# Patient Record
Sex: Male | Born: 1965 | Race: Black or African American | Hispanic: No | Marital: Married | Smoking: Never smoker
Health system: Southern US, Community
[De-identification: ages and names within clinical notes are randomized; demographics above are authoritative.]

## PROBLEM LIST (undated history)

## (undated) DIAGNOSIS — D649 Anemia, unspecified: Secondary | ICD-10-CM

## (undated) DIAGNOSIS — K635 Polyp of colon: Secondary | ICD-10-CM

## (undated) DIAGNOSIS — E039 Hypothyroidism, unspecified: Secondary | ICD-10-CM

## (undated) DIAGNOSIS — K219 Gastro-esophageal reflux disease without esophagitis: Secondary | ICD-10-CM

## (undated) DIAGNOSIS — E7800 Pure hypercholesterolemia, unspecified: Secondary | ICD-10-CM

## (undated) DIAGNOSIS — E119 Type 2 diabetes mellitus without complications: Secondary | ICD-10-CM

## (undated) DIAGNOSIS — I10 Essential (primary) hypertension: Secondary | ICD-10-CM

## (undated) DIAGNOSIS — G43909 Migraine, unspecified, not intractable, without status migrainosus: Secondary | ICD-10-CM

## (undated) DIAGNOSIS — N419 Inflammatory disease of prostate, unspecified: Secondary | ICD-10-CM

## (undated) DIAGNOSIS — F419 Anxiety disorder, unspecified: Secondary | ICD-10-CM

## (undated) DIAGNOSIS — N200 Calculus of kidney: Secondary | ICD-10-CM

## (undated) HISTORY — PX: CARDIAC CATHETERIZATION: SHX172

## (undated) HISTORY — PX: THYROIDECTOMY: SHX17

---

## 1999-10-24 ENCOUNTER — Encounter: Payer: Self-pay | Admitting: Family Medicine

## 1999-10-24 ENCOUNTER — Ambulatory Visit (HOSPITAL_COMMUNITY): Admission: RE | Admit: 1999-10-24 | Discharge: 1999-10-24 | Payer: Self-pay | Admitting: Family Medicine

## 2000-01-11 ENCOUNTER — Emergency Department (HOSPITAL_COMMUNITY): Admission: EM | Admit: 2000-01-11 | Discharge: 2000-01-11 | Payer: Self-pay

## 2006-09-27 HISTORY — PX: OTHER SURGICAL HISTORY: SHX169

## 2006-09-28 HISTORY — PX: CARDIAC CATHETERIZATION: SHX172

## 2006-09-28 HISTORY — PX: TRANSTHORACIC ECHOCARDIOGRAM: SHX275

## 2006-09-29 HISTORY — PX: OTHER SURGICAL HISTORY: SHX169

## 2008-08-03 ENCOUNTER — Ambulatory Visit (HOSPITAL_COMMUNITY): Admission: RE | Admit: 2008-08-03 | Discharge: 2008-08-03 | Payer: Self-pay | Admitting: Family Medicine

## 2009-05-26 HISTORY — PX: CARDIAC CATHETERIZATION: SHX172

## 2015-05-27 HISTORY — PX: THYROIDECTOMY: SHX17

## 2015-10-29 ENCOUNTER — Encounter (HOSPITAL_COMMUNITY): Payer: Self-pay | Admitting: Nurse Practitioner

## 2015-10-29 ENCOUNTER — Emergency Department (HOSPITAL_COMMUNITY): Payer: BLUE CROSS/BLUE SHIELD

## 2015-10-29 ENCOUNTER — Inpatient Hospital Stay (HOSPITAL_COMMUNITY)
Admission: EM | Admit: 2015-10-29 | Discharge: 2015-10-31 | DRG: 690 | Disposition: A | Discharge status: Home / Self Care | Payer: BLUE CROSS/BLUE SHIELD | Attending: Internal Medicine | Admitting: Internal Medicine

## 2015-10-29 DIAGNOSIS — E876 Hypokalemia: Secondary | ICD-10-CM | POA: Diagnosis present

## 2015-10-29 DIAGNOSIS — E860 Dehydration: Secondary | ICD-10-CM

## 2015-10-29 DIAGNOSIS — R531 Weakness: Secondary | ICD-10-CM

## 2015-10-29 DIAGNOSIS — Z823 Family history of stroke: Secondary | ICD-10-CM

## 2015-10-29 DIAGNOSIS — N400 Enlarged prostate without lower urinary tract symptoms: Secondary | ICD-10-CM | POA: Diagnosis present

## 2015-10-29 DIAGNOSIS — A419 Sepsis, unspecified organism: Secondary | ICD-10-CM | POA: Diagnosis not present

## 2015-10-29 DIAGNOSIS — Z7982 Long term (current) use of aspirin: Secondary | ICD-10-CM

## 2015-10-29 DIAGNOSIS — H109 Unspecified conjunctivitis: Secondary | ICD-10-CM

## 2015-10-29 DIAGNOSIS — R509 Fever, unspecified: Secondary | ICD-10-CM

## 2015-10-29 DIAGNOSIS — Z79899 Other long term (current) drug therapy: Secondary | ICD-10-CM

## 2015-10-29 DIAGNOSIS — Z86010 Personal history of colonic polyps: Secondary | ICD-10-CM

## 2015-10-29 DIAGNOSIS — Z808 Family history of malignant neoplasm of other organs or systems: Secondary | ICD-10-CM

## 2015-10-29 DIAGNOSIS — Z79891 Long term (current) use of opiate analgesic: Secondary | ICD-10-CM

## 2015-10-29 DIAGNOSIS — G4730 Sleep apnea, unspecified: Secondary | ICD-10-CM | POA: Diagnosis present

## 2015-10-29 DIAGNOSIS — D72829 Elevated white blood cell count, unspecified: Secondary | ICD-10-CM

## 2015-10-29 DIAGNOSIS — N410 Acute prostatitis: Secondary | ICD-10-CM | POA: Diagnosis present

## 2015-10-29 DIAGNOSIS — I10 Essential (primary) hypertension: Secondary | ICD-10-CM | POA: Diagnosis present

## 2015-10-29 DIAGNOSIS — H539 Unspecified visual disturbance: Secondary | ICD-10-CM | POA: Diagnosis present

## 2015-10-29 DIAGNOSIS — N390 Urinary tract infection, site not specified: Principal | ICD-10-CM | POA: Diagnosis present

## 2015-10-29 DIAGNOSIS — Z833 Family history of diabetes mellitus: Secondary | ICD-10-CM

## 2015-10-29 DIAGNOSIS — E7800 Pure hypercholesterolemia, unspecified: Secondary | ICD-10-CM | POA: Diagnosis present

## 2015-10-29 DIAGNOSIS — G43909 Migraine, unspecified, not intractable, without status migrainosus: Secondary | ICD-10-CM | POA: Diagnosis present

## 2015-10-29 DIAGNOSIS — Z0189 Encounter for other specified special examinations: Secondary | ICD-10-CM

## 2015-10-29 HISTORY — DX: Pure hypercholesterolemia, unspecified: E78.00

## 2015-10-29 HISTORY — DX: Polyp of colon: K63.5

## 2015-10-29 HISTORY — DX: Essential (primary) hypertension: I10

## 2015-10-29 HISTORY — DX: Inflammatory disease of prostate, unspecified: N41.9

## 2015-10-29 LAB — URINALYSIS, ROUTINE W REFLEX MICROSCOPIC
Glucose, UA: NEGATIVE mg/dL
Ketones, ur: 40 mg/dL — AB
Nitrite: NEGATIVE
Protein, ur: 100 mg/dL — AB
Specific Gravity, Urine: 1.03 (ref 1.005–1.030)
pH: 5.5 (ref 5.0–8.0)

## 2015-10-29 LAB — COMPREHENSIVE METABOLIC PANEL
ALT: 16 U/L — ABNORMAL LOW (ref 17–63)
AST: 16 U/L (ref 15–41)
Albumin: 4.3 g/dL (ref 3.5–5.0)
Alkaline Phosphatase: 62 U/L (ref 38–126)
Anion gap: 8 (ref 5–15)
BUN: 17 mg/dL (ref 6–20)
CO2: 27 mmol/L (ref 22–32)
Calcium: 9.1 mg/dL (ref 8.9–10.3)
Chloride: 103 mmol/L (ref 101–111)
Creatinine, Ser: 1.23 mg/dL (ref 0.61–1.24)
GFR calc Af Amer: >60 mL/min (ref >60)
GFR calc non Af Amer: >60 mL/min (ref >60)
Glucose, Bld: 125 mg/dL — ABNORMAL HIGH (ref 65–99)
Potassium: 3 mmol/L — ABNORMAL LOW (ref 3.5–5.1)
Sodium: 138 mmol/L (ref 135–145)
Total Bilirubin: 1.3 mg/dL — ABNORMAL HIGH (ref 0.3–1.2)
Total Protein: 8.1 g/dL (ref 6.5–8.1)

## 2015-10-29 LAB — I-STAT CG4 LACTIC ACID, ED: Lactic Acid, Venous: 1.06 mmol/L (ref 0.5–2.0)

## 2015-10-29 LAB — URINE MICROSCOPIC-ADD ON

## 2015-10-29 LAB — CBC
HCT: 40.1 % (ref 39.0–52.0)
Hemoglobin: 14.1 g/dL (ref 13.0–17.0)
MCH: 28.4 pg (ref 26.0–34.0)
MCHC: 35.2 g/dL (ref 30.0–36.0)
MCV: 80.7 fL (ref 78.0–100.0)
Platelets: 223 10*3/uL (ref 150–400)
RBC: 4.97 MIL/uL (ref 4.22–5.81)
RDW: 14.2 % (ref 11.5–15.5)
WBC: 13.6 10*3/uL — ABNORMAL HIGH (ref 4.0–10.5)

## 2015-10-29 LAB — LIPASE, BLOOD: Lipase: 20 U/L (ref 11–51)

## 2015-10-29 LAB — RAPID STREP SCREEN (MED CTR MEBANE ONLY): Streptococcus, Group A Screen (Direct): NEGATIVE

## 2015-10-29 MED ORDER — SODIUM CHLORIDE 0.9 % IV BOLUS (SEPSIS)
30.0000 mL/kg | Freq: Once | INTRAVENOUS | Status: AC
  Administered 2015-10-29: 3837 mL via INTRAVENOUS

## 2015-10-29 MED ORDER — ONDANSETRON HCL 4 MG/2ML IJ SOLN
4.0000 mg | Freq: Once | INTRAMUSCULAR | Status: AC | PRN
Start: 2015-10-29 — End: 2015-10-29
  Administered 2015-10-29: 4 mg via INTRAVENOUS
  Filled 2015-10-29: qty 2

## 2015-10-29 MED ORDER — SODIUM CHLORIDE 0.9 % IV SOLN
Freq: Once | INTRAVENOUS | Status: AC
  Administered 2015-10-29: 22:00:00 via INTRAVENOUS

## 2015-10-29 MED ORDER — DEXTROSE 5 % IV SOLN
2.0000 g | Freq: Once | INTRAVENOUS | Status: AC
  Administered 2015-10-29: 2 g via INTRAVENOUS
  Filled 2015-10-29: qty 2

## 2015-10-29 MED ORDER — DEXAMETHASONE SODIUM PHOSPHATE 4 MG/ML IJ SOLN
10.0000 mg | Freq: Once | INTRAMUSCULAR | Status: AC
  Administered 2015-10-30: 10 mg via INTRAVENOUS
  Filled 2015-10-29: qty 3

## 2015-10-29 MED ORDER — LEVOFLOXACIN IN D5W 500 MG/100ML IV SOLN
500.0000 mg | Freq: Once | INTRAVENOUS | Status: AC
  Administered 2015-10-29: 500 mg via INTRAVENOUS
  Filled 2015-10-29: qty 100

## 2015-10-29 MED ORDER — HYDROMORPHONE HCL 1 MG/ML IJ SOLN
0.5000 mg | Freq: Once | INTRAMUSCULAR | Status: AC
  Administered 2015-10-29: 0.5 mg via INTRAVENOUS
  Filled 2015-10-29: qty 1

## 2015-10-29 MED ORDER — METOCLOPRAMIDE HCL 5 MG/ML IJ SOLN
10.0000 mg | Freq: Once | INTRAMUSCULAR | Status: AC
  Administered 2015-10-29: 10 mg via INTRAVENOUS
  Filled 2015-10-29: qty 2

## 2015-10-29 MED ORDER — DIPHENHYDRAMINE HCL 50 MG/ML IJ SOLN
25.0000 mg | Freq: Once | INTRAMUSCULAR | Status: AC
  Administered 2015-10-29: 25 mg via INTRAVENOUS
  Filled 2015-10-29: qty 1

## 2015-10-29 NOTE — ED Notes (Signed)
MD at bedside checking pts entire body for ticks

## 2015-10-29 NOTE — ED Provider Notes (Signed)
CSN: [id number]     Arrival date & time 10/29/15  1748 History   First MD Initiated Contact with Patient 10/29/15 1752     Chief Complaint  Patient presents with  . Nausea  . Emesis     (Consider location/radiation/quality/duration/timing/severity/associated sxs/prior Treatment) HPI First symptoms started about 7 days ago, developed headache with nausea and vomiting, mostly dry heaves. About 3 episodes actual vomiting. Hasn't been able to eat due to nausea. Has H/O migraines approximately 20 years. H/A migratory from back of head to temples. Area of H/A typical but progressed to be more severe than typical migraine. Usually gets relief from Benicar. Sumatriptan not usually effective. B/L blurred vision, typical symptom. Sent from PCP office for concern of severe dehydration, patient reports an 18 pound weight loss since his illness. Patient also reports his physician had concern for prostate infection as the patient has had approximately 3 days of dysuria.. First documented fever today at PCP office 102. First chills and sweats yesterdays evening. Had sore throat last week, improved now. Past Medical History  Diagnosis Date  . Hypertension   . Hypercholesteremia   . Prostatitis   . Colon polyp    Past Surgical History  Procedure Laterality Date  . Cardiac catheterization  1995, 2011   Family History  Problem Relation Age of Onset  . Brain cancer Mother   . Diabetes Father   . Stroke Father    Social History  Substance Use Topics  . Smoking status: Never Smoker   . Smokeless tobacco: None  . Alcohol Use: Yes     Comment: 1x/year    Review of Systems 10 Systems reviewed and are negative for acute change except as noted in the HPI.   Allergies  Review of patient's allergies indicates no known allergies.  Home Medications   Prior to Admission medications   Medication Sig Start Date End Date Taking? Authorizing Provider  acetaminophen (TYLENOL) 325 MG tablet Take 650 mg by  mouth every 6 (six) hours as needed for fever.   Yes Historical Provider, MD  aspirin EC 81 MG tablet Take 81 mg by mouth daily.   Yes Historical Provider, MD  HYDROcodone-acetaminophen (NORCO/VICODIN) 5-325 MG tablet Take 1 tablet by mouth every 6 (six) hours as needed for moderate pain or severe pain (migraine pain).  10/25/15  Yes Historical Provider, MD  SUMAtriptan (IMITREX) 100 MG tablet Take 100 mg by mouth every 2 (two) hours as needed for migraine.  10/25/15  Yes Historical Provider, MD  atorvastatin (LIPITOR) 20 MG tablet Take 20 mg by mouth daily. Reported on 10/29/2015 10/25/15   Historical Provider, MD  olmesartan-hydrochlorothiazide (BENICAR HCT) 40-25 MG tablet Take 1 tablet by mouth daily.  10/26/15   Historical Provider, MD  zolpidem (AMBIEN) 10 MG tablet Take 10 mg by mouth at bedtime.  10/25/15   Historical Provider, MD   BP 129/106 mmHg  Pulse 67  Temp(Src) 100.7 F (38.2 C) (Oral)  Resp 25  Ht  (1.778 m)  Wt 282 lb (127.914 kg)  BMI 40.46 kg/m2  SpO2 96% Physical Exam  Constitutional: He is oriented to person, place, and time.  Patient is moderately obese. Alert and nontoxic. No respiratory distress.  HENT:  Head: Normocephalic and atraumatic.  Right Ear: External ear normal.  Left Ear: External ear normal.  Nose: Nose normal.  Mouth/Throat: Oropharynx is clear and moist.  Bilateral TMs no erythema or bulging.  Eyes: EOM are normal. Pupils are equal, round, and reactive to  light.  Mild scleral injection right eye with very small amount of drainage in the medial canthus.  Neck: Neck supple.  No meningismus. Normal range of motion.  Cardiovascular: Normal rate, regular rhythm, normal heart sounds and intact distal pulses.   Pulmonary/Chest: Effort normal and breath sounds normal.  Abdominal: Soft. Bowel sounds are normal. He exhibits no distension. There is no tenderness.  Genitourinary: Penis normal.  Musculoskeletal: Normal range of motion. He exhibits no edema or  tenderness.  Lymphadenopathy:    He has no cervical adenopathy.  Neurological: He is alert and oriented to person, place, and time. No cranial nerve deficit. He exhibits normal muscle tone. Coordination normal.  Skin: Skin is warm and dry.  All skin surfaces were closely examined for evidence of insect bites, rash. No acute-appearing areas identified. Patient has several areas of older scars and papules but none appear infectious.  Psychiatric: He has a normal mood and affect.    ED Course  Procedures (including critical care time) Labs Review Labs Reviewed  COMPREHENSIVE METABOLIC PANEL - Abnormal; Notable for the following:    Potassium 3.0 (*)    Glucose, Bld 125 (*)    ALT 16 (*)    Total Bilirubin 1.3 (*)    All other components within normal limits  CBC - Abnormal; Notable for the following:    WBC 13.6 (*)    All other components within normal limits  URINALYSIS, ROUTINE W REFLEX MICROSCOPIC (NOT AT [facility]) - Abnormal; Notable for the following:    Color, Urine AMBER (*)    APPearance CLOUDY (*)    [facility] MODERATE (*)    Bilirubin Urine SMALL (*)    Ketones, ur 40 (*)    Protein, ur 100 (*)    Leukocytes, UA SMALL (*)    All other components within normal limits  URINE MICROSCOPIC-ADD ON - Abnormal; Notable for the following:    Squamous Epithelial / LPF 6-30 (*)    Bacteria, UA FEW (*)    All other components within normal limits  RAPID STREP SCREEN (NOT AT [facility])  URINE CULTURE  CULTURE, BLOOD (ROUTINE X 2)  CULTURE, BLOOD (ROUTINE X 2)  CULTURE, GROUP A STREP ([facility])  LIPASE, BLOOD  RPR  HIV ANTIBODY (ROUTINE TESTING)  I-STAT CG4 LACTIC ACID, ED  GC/CHLAMYDIA PROBE AMP ([place]) NOT AT [facility]    Imaging Review Dg Chest 2 View  10/29/2015  CLINICAL DATA:  Cough and mid chest pain for 2-3 days EXAM: CHEST  2 VIEW COMPARISON:  None. FINDINGS: The heart size and mediastinal contours are within normal limits. Both lungs are clear. The visualized skeletal  structures are unremarkable. IMPRESSION: No active cardiopulmonary disease. Electronically Signed   By: Alcide CleverMark  Lukens M.D.   On: 10/29/2015 19:30   Ct Head Wo Contrast  10/29/2015  CLINICAL DATA:  Nausea, vomiting, cough and headaches. EXAM: CT HEAD WITHOUT CONTRAST TECHNIQUE: Contiguous axial images were obtained from the base of the skull through the vertex without intravenous contrast. COMPARISON:  None. FINDINGS: No evidence of parenchymal hemorrhage or extra-axial fluid collection. No mass lesion, mass effect, or midline shift. No CT evidence of acute infarction. There are coarse plaque like dural calcifications along interhemispheric falx. Cerebral volume is age appropriate. No ventriculomegaly. Mild mucoperiosteal thickening in the bilateral ethmoidal air cells. No fluid levels in the visualized paranasal sinuses. The mastoid air cells are unopacified. No evidence of calvarial fracture. IMPRESSION: 1. No evidence of acute intracranial abnormality. 2. Mild paranasal  sinusitis, probably chronic . Electronically Signed   By: Delbert Phenix M.D.   On: 10/29/2015 19:43   I have personally reviewed and evaluated these images and lab results as part of my medical decision-making.   EKG Interpretation   Date/Time:  Monday October 29 2015 19:43:36 EDT Ventricular Rate:  81 PR Interval:  184 QRS Duration: 97 QT Interval:  364 QTC Calculation: 422 R Axis:   89 Text Interpretation:  Sinus rhythm Confirmed by Donnald Garre, MD, Lebron Conners  [phone]) on 10/29/2015 9:39:45 PM     Recheck at 22:05 patient feels improved with hydration. Mental status is clear. Patient has no respiratory distress. Vital signs stable. On salt: [facility] for admission. MDM   Final diagnoses:  Dehydration  Weakness  Fever in adult  UTI (lower urinary tract infection)   Patient presents with febrile illness. He has been sick for approximately a week. Patient is sent from PCP office with report of fever and orthostasis. Etiology  appears to be urinary tract infection was grossly positive UA. Patient's skin was carefully examined for any evidence of cellulitis or insect bite. Patient does not have any known history of bite or tick removal. Patient has had no chest pain or esterase symptoms. He also denies flank pain or abdominal pain and has no history of kidney stone. At this time, constitutional symptoms are suspected to be secondary to significant UTI. Patient be admitted for observation.    Arby Barrette, MD 10/29/15 2237

## 2015-10-29 NOTE — ED Notes (Signed)
Bed: WA20 Expected date:  Expected time:  Means of arrival:  Comments: EMS- 50yo M, n/v/urinary retention/febrile/ortho static

## 2015-10-29 NOTE — ED Notes (Signed)
Patient presents to [facility] via [facility] for complaints of nausea and vomiting that began Thursday. He reports 18 lb weight loss due to not being able to consume fluids or keep fluids down.

## 2015-10-29 NOTE — ED Notes (Signed)
Attempted to call report.  Was informed by Revonda StandardAllison, Licensed conveyancerunit secretary, nurse has been called in & will call upon arrival.

## 2015-10-30 ENCOUNTER — Observation Stay (HOSPITAL_COMMUNITY): Payer: BLUE CROSS/BLUE SHIELD

## 2015-10-30 DIAGNOSIS — H109 Unspecified conjunctivitis: Secondary | ICD-10-CM | POA: Diagnosis present

## 2015-10-30 DIAGNOSIS — Z808 Family history of malignant neoplasm of other organs or systems: Secondary | ICD-10-CM | POA: Diagnosis not present

## 2015-10-30 DIAGNOSIS — Z86010 Personal history of colonic polyps: Secondary | ICD-10-CM | POA: Diagnosis not present

## 2015-10-30 DIAGNOSIS — E876 Hypokalemia: Secondary | ICD-10-CM | POA: Diagnosis present

## 2015-10-30 DIAGNOSIS — Z833 Family history of diabetes mellitus: Secondary | ICD-10-CM | POA: Diagnosis not present

## 2015-10-30 DIAGNOSIS — G43909 Migraine, unspecified, not intractable, without status migrainosus: Secondary | ICD-10-CM | POA: Diagnosis present

## 2015-10-30 DIAGNOSIS — R509 Fever, unspecified: Secondary | ICD-10-CM | POA: Diagnosis not present

## 2015-10-30 DIAGNOSIS — G43819 Other migraine, intractable, without status migrainosus: Secondary | ICD-10-CM | POA: Diagnosis not present

## 2015-10-30 DIAGNOSIS — D72829 Elevated white blood cell count, unspecified: Secondary | ICD-10-CM | POA: Diagnosis not present

## 2015-10-30 DIAGNOSIS — H539 Unspecified visual disturbance: Secondary | ICD-10-CM | POA: Diagnosis present

## 2015-10-30 DIAGNOSIS — G4730 Sleep apnea, unspecified: Secondary | ICD-10-CM | POA: Diagnosis present

## 2015-10-30 DIAGNOSIS — Z7982 Long term (current) use of aspirin: Secondary | ICD-10-CM | POA: Diagnosis not present

## 2015-10-30 DIAGNOSIS — Z79891 Long term (current) use of opiate analgesic: Secondary | ICD-10-CM | POA: Diagnosis not present

## 2015-10-30 DIAGNOSIS — E7800 Pure hypercholesterolemia, unspecified: Secondary | ICD-10-CM | POA: Diagnosis present

## 2015-10-30 DIAGNOSIS — Z79899 Other long term (current) drug therapy: Secondary | ICD-10-CM | POA: Diagnosis not present

## 2015-10-30 DIAGNOSIS — N390 Urinary tract infection, site not specified: Secondary | ICD-10-CM | POA: Diagnosis present

## 2015-10-30 DIAGNOSIS — N400 Enlarged prostate without lower urinary tract symptoms: Secondary | ICD-10-CM | POA: Diagnosis present

## 2015-10-30 DIAGNOSIS — I10 Essential (primary) hypertension: Secondary | ICD-10-CM | POA: Diagnosis present

## 2015-10-30 DIAGNOSIS — E860 Dehydration: Secondary | ICD-10-CM | POA: Diagnosis present

## 2015-10-30 DIAGNOSIS — Z823 Family history of stroke: Secondary | ICD-10-CM | POA: Diagnosis not present

## 2015-10-30 DIAGNOSIS — A419 Sepsis, unspecified organism: Secondary | ICD-10-CM | POA: Diagnosis present

## 2015-10-30 DIAGNOSIS — N410 Acute prostatitis: Secondary | ICD-10-CM | POA: Diagnosis present

## 2015-10-30 LAB — BASIC METABOLIC PANEL
Anion gap: 9 (ref 5–15)
BUN: 15 mg/dL (ref 6–20)
CO2: 24 mmol/L (ref 22–32)
Calcium: 8.5 mg/dL — ABNORMAL LOW (ref 8.9–10.3)
Chloride: 105 mmol/L (ref 101–111)
Creatinine, Ser: 1.11 mg/dL (ref 0.61–1.24)
GFR calc Af Amer: >60 mL/min (ref >60)
GFR calc non Af Amer: >60 mL/min (ref >60)
Glucose, Bld: 173 mg/dL — ABNORMAL HIGH (ref 65–99)
Potassium: 3.3 mmol/L — ABNORMAL LOW (ref 3.5–5.1)
Sodium: 138 mmol/L (ref 135–145)

## 2015-10-30 LAB — CBC
HCT: 39.2 % (ref 39.0–52.0)
Hemoglobin: 13.1 g/dL (ref 13.0–17.0)
MCH: 27.2 pg (ref 26.0–34.0)
MCHC: 33.4 g/dL (ref 30.0–36.0)
MCV: 81.3 fL (ref 78.0–100.0)
Platelets: 204 10*3/uL (ref 150–400)
RBC: 4.82 MIL/uL (ref 4.22–5.81)
RDW: 14.2 % (ref 11.5–15.5)
WBC: 11.2 10*3/uL — ABNORMAL HIGH (ref 4.0–10.5)

## 2015-10-30 LAB — PROCALCITONIN: Procalcitonin: 0.3 ng/mL

## 2015-10-30 LAB — HIV ANTIBODY (ROUTINE TESTING W REFLEX): HIV Screen 4th Generation wRfx: NONREACTIVE

## 2015-10-30 LAB — RPR: RPR Ser Ql: NONREACTIVE

## 2015-10-30 MED ORDER — IRBESARTAN 300 MG PO TABS
300.0000 mg | ORAL_TABLET | Freq: Every day | ORAL | Status: DC
  Administered 2015-10-30 – 2015-10-31 (×2): 300 mg via ORAL
  Filled 2015-10-30 (×2): qty 2
  Filled 2015-10-30 (×2): qty 1
  Filled 2015-10-30 (×2): qty 2

## 2015-10-30 MED ORDER — HYDROMORPHONE HCL 1 MG/ML IJ SOLN: 0.5000 mg | INTRAMUSCULAR | Status: DC | PRN

## 2015-10-30 MED ORDER — MAGNESIUM SULFATE 2 GM/50ML IV SOLN: 2.0000 g | Freq: Four times a day (QID) | INTRAVENOUS | Status: DC | PRN

## 2015-10-30 MED ORDER — DEXTROSE 5 % IV SOLN
2.0000 g | INTRAVENOUS | Status: DC
  Administered 2015-10-30: 2 g via INTRAVENOUS
  Filled 2015-10-30: qty 2

## 2015-10-30 MED ORDER — DEXTROSE 5 % IV SOLN: 2.0000 g | Freq: Once | INTRAVENOUS | Status: DC

## 2015-10-30 MED ORDER — ASPIRIN EC 81 MG PO TBEC
81.0000 mg | DELAYED_RELEASE_TABLET | Freq: Every day | ORAL | Status: DC
  Administered 2015-10-30 – 2015-10-31 (×2): 81 mg via ORAL
  Filled 2015-10-30 (×3): qty 1

## 2015-10-30 MED ORDER — ONDANSETRON HCL 4 MG/2ML IJ SOLN: 4.0000 mg | Freq: Four times a day (QID) | INTRAMUSCULAR | Status: DC | PRN

## 2015-10-30 MED ORDER — ONDANSETRON HCL 4 MG PO TABS: 4.0000 mg | ORAL_TABLET | Freq: Four times a day (QID) | ORAL | Status: DC | PRN

## 2015-10-30 MED ORDER — METOCLOPRAMIDE HCL 5 MG/ML IJ SOLN
10.0000 mg | Freq: Four times a day (QID) | INTRAMUSCULAR | Status: DC | PRN
  Administered 2015-10-30: 10 mg via INTRAVENOUS
  Filled 2015-10-30: qty 2

## 2015-10-30 MED ORDER — ACETAMINOPHEN 325 MG PO TABS: 650.0000 mg | ORAL_TABLET | Freq: Four times a day (QID) | ORAL | Status: DC | PRN

## 2015-10-30 MED ORDER — HYDROCODONE-ACETAMINOPHEN 5-325 MG PO TABS: 1.0000 | ORAL_TABLET | Freq: Four times a day (QID) | ORAL | Status: DC | PRN

## 2015-10-30 MED ORDER — DIPHENHYDRAMINE HCL 50 MG/ML IJ SOLN
25.0000 mg | Freq: Four times a day (QID) | INTRAMUSCULAR | Status: DC | PRN
  Administered 2015-10-30: 25 mg via INTRAVENOUS
  Filled 2015-10-30: qty 1

## 2015-10-30 MED ORDER — POTASSIUM CHLORIDE CRYS ER 20 MEQ PO TBCR
40.0000 meq | EXTENDED_RELEASE_TABLET | ORAL | Status: AC
  Administered 2015-10-30 (×2): 40 meq via ORAL
  Filled 2015-10-30 (×2): qty 2

## 2015-10-30 MED ORDER — ACETAMINOPHEN 650 MG RE SUPP: 650.0000 mg | Freq: Four times a day (QID) | RECTAL | Status: DC | PRN

## 2015-10-30 MED ORDER — ATORVASTATIN CALCIUM 10 MG PO TABS
20.0000 mg | ORAL_TABLET | Freq: Every day | ORAL | Status: DC
Start: 2015-10-30 — End: 2015-10-31
  Administered 2015-10-30: 20 mg via ORAL
  Filled 2015-10-30 (×2): qty 2

## 2015-10-30 MED ORDER — SODIUM CHLORIDE 0.45 % IV SOLN
INTRAVENOUS | Status: DC
  Administered 2015-10-30: 18:00:00 via INTRAVENOUS

## 2015-10-30 MED ORDER — PREMIER PROTEIN SHAKE
11.0000 [oz_av] | Freq: Two times a day (BID) | ORAL | Status: DC
  Administered 2015-10-31: 11 [oz_av] via ORAL
  Filled 2015-10-30 (×2): qty 325.31

## 2015-10-30 MED ORDER — ERYTHROMYCIN 5 MG/GM OP OINT
TOPICAL_OINTMENT | Freq: Four times a day (QID) | OPHTHALMIC | Status: DC
  Administered 2015-10-30: 05:00:00 via OPHTHALMIC
  Filled 2015-10-30: qty 3.5

## 2015-10-30 MED ORDER — SODIUM CHLORIDE 0.9 % IV SOLN
INTRAVENOUS | Status: AC
  Administered 2015-10-30: 05:00:00 via INTRAVENOUS

## 2015-10-30 NOTE — Progress Notes (Signed)
PROGRESS NOTE    Carlos Greene  ZOX:[id number] DOB: 05-31-1965 DOA: 10/29/2015  PCP: No primary care provider on file.   Brief Narrative:  50 y/o with HTN h/o Prostatitis presents to the ER for 1 wk of unremitting headaches despite using Imitrex and Hydrocodone ordered by his PCP. No sinus drainage but had a sore dry throat.  He was found to have a UTI and fever of 102.7. He admitted having dysuria. He has had prostatitis twice in the past.   Subjective: Headache has resolved. Per his wife, he has frequent headaches due to stress at work but none this severe. He states "come to think about it" he has been having frequent headaches but cannot specify a trigger. He was not aware that he was dehydrated or that he had a UTI.   Assessment & Plan:   Principal Problem:   UTI (lower urinary tract infection)- sepsis with fever and leukocytosis - possible prostatitis - cont Rocephin- f/u culture - can f/u with his urologist later this week or next wk  Active Problems:   Migraine headache - resolved with Reglan, Decadron and Benadryl - can prescribe a cocktail of  Mg+, Benadryl and Reglan for future episodes- I have discussed with him what his triggers may be  - will need to see neurology if these methods are ineffective.  - asked to stop Triptan and Hydrocodone as they have not helped him  Dry eyes - worse when he is on his computer- Refresh or Systane over the counter  HTN - cont home meds.   Dehydration - cont IVF today   DVT prophylaxis: SCDs Code Status: full code Family Communication: wife Disposition Plan: home tomorrow Consultants:   none Procedures:   none Antimicrobials:  Anti-infectives    Start     Dose/Rate Route Frequency Ordered Stop   10/30/15 2200  cefTRIAXone (ROCEPHIN) 2 g in dextrose 5 % 50 mL IVPB     2 g 100 mL/hr over 30 Minutes Intravenous Every 24 hours 10/30/15 0231     10/30/15 0130  cefTRIAXone (ROCEPHIN) 2 g in dextrose 5 % 50 mL IVPB  Status:   Discontinued     2 g 100 mL/hr over 30 Minutes Intravenous  Once 10/30/15 0120 10/30/15 0123   10/29/15 2115  cefTRIAXone (ROCEPHIN) 2 g in dextrose 5 % 50 mL IVPB     2 g 100 mL/hr over 30 Minutes Intravenous  Once 10/29/15 2102 10/30/15 0006   10/29/15 2115  levofloxacin (LEVAQUIN) IVPB 500 mg     500 mg 100 mL/hr over 60 Minutes Intravenous  Once 10/29/15 2102 10/30/15 0006       Objective: Filed Vitals:   10/30/15 0009 10/30/15 0030 10/30/15 0125 10/30/15 0650  BP:  136/73 138/83 100/61  Pulse:  85 82 101  Temp: 101.1 F (38.4 C) 100.7 F (38.2 C) 99.7 F (37.6 C) 98.7 F (37.1 C)  TempSrc: Oral Oral Oral Oral  Resp:  16 20 18   Height:   5\' 10"  (1.778 m)   Weight:   125.919 kg (277 lb 9.6 oz)   SpO2:  95% 98% 99%    Intake/Output Summary (Last 24 hours) at 10/30/15 1308 Last data filed at 10/30/15 0900  Gross per 24 hour  Intake    720 ml  Output      0 ml  Net    720 ml   Filed Weights   10/29/15 1936 10/29/15 1945 10/30/15 0125  Weight: 127.914 kg (282 lb) 127.914  kg (282 lb) 125.919 kg (277 lb 9.6 oz)    Examination: General exam: Appears comfortable  HEENT: PERRLA, oral mucosa moist, no sclera icterus or thrush Respiratory system: Clear to auscultation. Respiratory effort normal. Cardiovascular system: S1 & S2 heard, RRR.  No murmurs  Gastrointestinal system: Abdomen soft, non-tender, nondistended. Normal bowel sound. No organomegaly Central nervous system: Alert and oriented. No focal neurological deficits. Extremities: No cyanosis, clubbing or edema Skin: No rashes or ulcers Psychiatry:  Mood & affect appropriate.     Data Reviewed: I have personally reviewed following labs and imaging studies  CBC:  Recent Labs Lab 10/29/15 1808 10/30/15 0121  WBC 13.6* 11.2*  HGB 14.1 13.1  HCT 40.1 39.2  MCV 80.7 81.3  PLT 223 204   Basic Metabolic Panel:  Recent Labs Lab 10/29/15 1808 10/30/15 0121  NA 138 138  K 3.0* 3.3*  CL 103 105  CO2 27  24  GLUCOSE 125* 173*  BUN 17 15  CREATININE 1.23 1.11  CALCIUM 9.1 8.5*   GFR: Estimated Creatinine Clearance: 103.7 mL/min (by C-G formula based on Cr of 1.11). Liver Function Tests:  Recent Labs Lab 10/29/15 1808  AST 16  ALT 16*  ALKPHOS 62  BILITOT 1.3*  PROT 8.1  ALBUMIN 4.3    Recent Labs Lab 10/29/15 1808  LIPASE 20   No results for input(s): AMMONIA in the last 168 hours. Coagulation Profile: No results for input(s): INR, PROTIME in the last 168 hours. Cardiac Enzymes: No results for input(s): CKTOTAL, CKMB, CKMBINDEX, TROPONINI in the last 168 hours. BNP (last 3 results) No results for input(s): PROBNP in the last 8760 hours. HbA1C: No results for input(s): HGBA1C in the last 72 hours. CBG: No results for input(s): GLUCAP in the last 168 hours. Lipid Profile: No results for input(s): CHOL, HDL, LDLCALC, TRIG, CHOLHDL, LDLDIRECT in the last 72 hours. Thyroid Function Tests: No results for input(s): TSH, T4TOTAL, FREET4, T3FREE, THYROIDAB in the last 72 hours. Anemia Panel: No results for input(s): VITAMINB12, FOLATE, FERRITIN, TIBC, IRON, RETICCTPCT in the last 72 hours. Urine analysis:    Component Value Date/Time   COLORURINE AMBER* 10/29/2015 1936   APPEARANCEUR CLOUDY* 10/29/2015 1936   LABSPEC 1.030 10/29/2015 1936   PHURINE 5.5 10/29/2015 1936   GLUCOSEU NEGATIVE 10/29/2015 1936   HGBUR MODERATE* 10/29/2015 1936   BILIRUBINUR SMALL* 10/29/2015 1936   KETONESUR 40* 10/29/2015 1936   PROTEINUR 100* 10/29/2015 1936   NITRITE NEGATIVE 10/29/2015 1936   LEUKOCYTESUR SMALL* 10/29/2015 1936   Sepsis Labs: @LABRCNTIP (procalcitonin:4,lacticidven:4) ) Recent Results (from the past 240 hour(s))  Culture, blood (routine x 2)     Status: None (Preliminary result)   Collection Time: 10/29/15  7:27 PM  Result Value Ref Range Status   Specimen Description BLOOD LEFT ARM  Final   Special Requests BOTTLES DRAWN AEROBIC AND ANAEROBIC 5CC  Final   Culture  PENDING  Incomplete   Report Status PENDING  Incomplete  Rapid strep screen     Status: None   Collection Time: 10/29/15  7:27 PM  Result Value Ref Range Status   Streptococcus, Group A Screen (Direct) NEGATIVE NEGATIVE Final    Comment: (NOTE) A Rapid Antigen test may result negative if the antigen level in the sample is below the detection level of this test. The FDA has not cleared this test as a stand-alone test therefore the rapid antigen negative result has reflexed to a Group A Strep culture.   Culture, group A strep  Status: None (Preliminary result)   Collection Time: 10/29/15  7:27 PM  Result Value Ref Range Status   Specimen Description THROAT  Final   Special Requests NONE  Final   Culture   Final    TOO YOUNG TO READ Performed at [facility]    Report Status PENDING  Incomplete  Culture, blood (routine x 2)     Status: None (Preliminary result)   Collection Time: 10/29/15  7:28 PM  Result Value Ref Range Status   Specimen Description BLOOD RIGHT ARM  Final   Special Requests BOTTLES DRAWN AEROBIC AND ANAEROBIC 5CC  Final   Culture PENDING  Incomplete   Report Status PENDING  Incomplete         Radiology Studies: Ct Abdomen Pelvis Wo Contrast  10/30/2015  CLINICAL DATA:  Fever.  UTI and prostatitis. EXAM: CT ABDOMEN AND PELVIS WITHOUT CONTRAST TECHNIQUE: Multidetector CT imaging of the abdomen and pelvis was performed following the standard protocol without IV contrast. COMPARISON:  None. FINDINGS: Lower chest and abdominal wall:  No contributory findings. Hepatobiliary: No focal liver abnormality.No evidence of biliary obstruction or stone. Pancreas: Unremarkable. Spleen: Unremarkable. Adrenals/Urinary Tract: Negative adrenals. No hydronephrosis or stone. No asymmetric stranding or nephromegaly. Unremarkable bladder. Reproductive:Haziness around a mildly enlarged prostate correlating with history of prostatitis. No asymmetry or focal finding to suggest  abscess. Stomach/Bowel:  No obstruction. No appendicitis. Vascular/Lymphatic: No acute vascular abnormality. No mass or adenopathy. Peritoneal: No ascites or pneumoperitoneum. Musculoskeletal: No acute abnormalities. IMPRESSION: 1. Pelvic stranding correlating with history prostatitis. No noncontrast evidence of abscess. 2. Normal appearance of the kidneys. Electronically Signed   By: Marnee Spring M.D.   On: 10/30/2015 04:58   Dg Chest 2 View  10/29/2015  CLINICAL DATA:  Cough and mid chest pain for 2-3 days EXAM: CHEST  2 VIEW COMPARISON:  None. FINDINGS: The heart size and mediastinal contours are within normal limits. Both lungs are clear. The visualized skeletal structures are unremarkable. IMPRESSION: No active cardiopulmonary disease. Electronically Signed   By: Alcide Clever M.D.   On: 10/29/2015 19:30   Ct Head Wo Contrast  10/29/2015  CLINICAL DATA:  Nausea, vomiting, cough and headaches. EXAM: CT HEAD WITHOUT CONTRAST TECHNIQUE: Contiguous axial images were obtained from the base of the skull through the vertex without intravenous contrast. COMPARISON:  None. FINDINGS: No evidence of parenchymal hemorrhage or extra-axial fluid collection. No mass lesion, mass effect, or midline shift. No CT evidence of acute infarction. There are coarse plaque like dural calcifications along interhemispheric falx. Cerebral volume is age appropriate. No ventriculomegaly. Mild mucoperiosteal thickening in the bilateral ethmoidal air cells. No fluid levels in the visualized paranasal sinuses. The mastoid air cells are unopacified. No evidence of calvarial fracture. IMPRESSION: 1. No evidence of acute intracranial abnormality. 2. Mild paranasal sinusitis, probably chronic . Electronically Signed   By: Delbert Phenix M.D.   On: 10/29/2015 19:43      Scheduled Meds: . sodium chloride   Intravenous STAT  . aspirin EC  81 mg Oral Daily  . atorvastatin  20 mg Oral Daily  . cefTRIAXone (ROCEPHIN)  IV  2 g Intravenous  Q24H  . erythromycin   Both Eyes Q6H  . irbesartan  300 mg Oral Daily   Continuous Infusions: . sodium chloride          Time spent in minutes: 35    Novie Maggio, MD [facility] Pager: [email or web address] Password [email or web address] 10/30/2015, 1:08 PM

## 2015-10-30 NOTE — Progress Notes (Signed)
Attempted report at 12:45 advised to call back

## 2015-10-30 NOTE — Progress Notes (Signed)
Initial Nutrition Assessment  DOCUMENTATION CODES:   Obesity unspecified  INTERVENTION:   -Provide Premier Protein supplements BID, each provides 160 kcal and 30g protein. -Reviewed healthy weight loss strategies per patient request. Will add information to Discharge instructions. -RD to continue to monitor  NUTRITION DIAGNOSIS:   Inadequate oral intake related to nausea, vomiting as evidenced by per patient/family report.  GOAL:   Patient will meet greater than or equal to 90% of their needs  MONITOR:   PO intake, Supplement acceptance, Labs, Weight trends, I & O's  REASON FOR ASSESSMENT:   Malnutrition Screening Tool    ASSESSMENT:   50 y/o with HTN h/o Prostatitis presents to the ER for 1 wk of unremitting headaches despite using Imitrex and Hydrocodone ordered by his PCP. No sinus drainage but had a sore dry throat.  Patient reports N/V for at least a week PTA. Pt was experiencing some headaches and was unable to keep any food or liquid down. States he has had a lot of stressors in his life from a son's wedding to the passing of his mother. He has lost 18 lb over 3-4 days, UBW is 302 lb. He states he has been trying to lose weight prior to these symptoms and recognizes that this is not a healthy way to lose weight. He plans to start exercising more and eat better. Reviewed healthy weight loss strategies with patient and offered to order a protein shake for him to try while he is here. Pt is willing to try Premier Protein, RD to order.  Patient's appetite has improved today since receiving antibiotics and IVF. Pt eating 100% of meals.  Nutrition focused physical exam shows no sign of depletion of muscle mass or body fat.  Medications reviewed. Labs reviewed: Low K  Diet Order:  Diet Heart Room service appropriate?: Yes; Fluid consistency:: Thin  Skin:  Reviewed, no issues  Last BM:  PTA  Height:   Ht Readings from Last 1 Encounters:  10/30/15 5\' 10"  (1.778 m)     Weight:   Wt Readings from Last 1 Encounters:  10/30/15 277 lb 9.6 oz (125.919 kg)    Ideal Body Weight:  75.5 kg  BMI:  Body mass index is 39.83 kg/(m^2).  Estimated Nutritional Needs:   Kcal:  2000-2200  Protein:  90-100g  Fluid:  2L/day  EDUCATION NEEDS:   Education needs addressed  Tilda FrancoLindsey Asante Blanda, MS, RD, LDN Pager: [phone] After Hours Pager: [phone]

## 2015-10-30 NOTE — Progress Notes (Signed)
Attempted report advised that nurse was business she would call back when available.

## 2015-10-30 NOTE — Progress Notes (Signed)
Pharmacy Antibiotic Note  Carlos Greene is a 50 y.o. male admitted on 10/29/2015 with UTI.  Pharmacy has been consulted for rocephin dosing.  Plan: Rocephin 2gm IV q24h  Height: 5\' 10"  (177.8 cm) Weight: 282 lb (127.914 kg) IBW/kg (Calculated) : 73  Temp (24hrs), Avg:100.8 F (38.2 C), Min:100.7 F (38.2 C), Max:101.1 F (38.4 C)   Recent Labs Lab 10/29/15 1808 10/29/15 2011  WBC 13.6*  --   CREATININE 1.23  --   LATICACIDVEN  --  1.06    Estimated Creatinine Clearance: 94.4 mL/min (by C-G formula based on Cr of 1.23).    No Known Allergies  Antimicrobials this admission: 6/5  rocephin >>   >>   Dose adjustments this admission:   Microbiology results:  BCx:   UCx:    Sputum:    MRSA PCR:   Thank you for allowing pharmacy to be a part of this patient's care.  Carlos Greene, Carlos Greene 10/30/2015 1:25 AM

## 2015-10-30 NOTE — Progress Notes (Addendum)
Attempted report at 12:10 place on hold:will call back

## 2015-10-30 NOTE — Progress Notes (Signed)
PHARMACY NOTE -  ceftriaxone  Pharmacy has been assisting with dosing of  ceftriaxone for suspected UTI/sespsis . Dosage remains stable at 2gm IV q24h and need for further dosage adjustment appears unlikely at present.    Will sign off at this time.  Please reconsult if a change in clinical status warrants re-evaluation of dosage.  Dorna LeitzAnh Frederic Tones, PharmD, BCPS 10/30/2015 9:01 AM

## 2015-10-30 NOTE — H&P (Signed)
History and Physical    Carlos Greene ZOX:[id number] DOB: 22-Nov-1965 DOA: 10/29/2015  PCP: Severiano Gilbert  Patient coming from: PCP's office  Chief Complaint: Nausea, vomiting, dysuria, fever, intractable headache with history of migraine  HPI: Carlos Greene is a 50 y.o. gentleman with a history of HTN, migraine headaches, and prior episodes of prostatitis (actively followed by Urology) who presents to the ED for evaluation of several persistent symptoms since Thursday.  He has had dysuria, frequency, difficulty initiating urine stream, and his wife has noticed a change in the color of his urine.  No significant odor.  No associated abdominal, pelvic, or flank pain, but he has had nausea and vomiting and pain in his lower back.  No documented fever until today.  His temperature was reportedly 102.7 in his PCP's office; 100.7 here.  He has had decreased appetite.  He has also had refractory headaches.  No associated neck pain or stiffness.  No associated neurological deficits.  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  No recent mosquito or tick bites.    ED Course: U/A is abnormal, mucous present and WBCs TNTC.  He has a leukocytosis of 13.6.  Rapid strep screen negative.  Chest xray negative for acute process.  Head CT shows probable chronic sinusitis; no acute findings.  He as received IV levaquin and rocephin, as well as aggressive volume resuscitation and analgesics as needed.  Headache still 5 out of 10 in intensity.  Hospitalist asked to admit.  Review of Systems: He reports 18lb weight gain this month.  Wife also mentions significant stressors in the past 30 days (death of his mother, then son's wedding).  As per HPI otherwise 10 point review of systems negative.    Past Medical History  Diagnosis Date  . Hypertension   . Hypercholesteremia   . Prostatitis   . Colon polyp     Past Surgical History  Procedure Laterality Date  . Cardiac  catheterization  1995, 2011     reports that he has never smoked. He does not have any smokeless tobacco history on file. He reports that he drinks alcohol. He reports that he does not use illicit drugs. He is married.  He has two adult sons.  No Known Allergies  Family History  Problem Relation Age of Onset  . Brain cancer Mother   . Diabetes Father   . Stroke Father     Prior to Admission medications   Medication Sig Start Date End Date Taking? Authorizing Provider  acetaminophen (TYLENOL) 325 MG tablet Take 650 mg by mouth every 6 (six) hours as needed for fever.   Yes Historical Provider, MD  aspirin EC 81 MG tablet Take 81 mg by mouth daily.   Yes Historical Provider, MD  HYDROcodone-acetaminophen (NORCO/VICODIN) 5-325 MG tablet Take 1 tablet by mouth every 6 (six) hours as needed for moderate pain or severe pain (migraine pain).  10/25/15  Yes Historical Provider, MD  SUMAtriptan (IMITREX) 100 MG tablet Take 100 mg by mouth every 2 (two) hours as needed for migraine.  10/25/15  Yes Historical Provider, MD  atorvastatin (LIPITOR) 20 MG tablet Take 20 mg by mouth daily. Reported on 10/29/2015 10/25/15   Historical Provider, MD  olmesartan-hydrochlorothiazide (BENICAR HCT) 40-25 MG tablet Take 1 tablet by mouth daily.  10/26/15   Historical Provider, MD  zolpidem (AMBIEN) 10 MG tablet Take 10 mg by mouth at bedtime.  10/25/15   Historical Provider, MD  Physical Exam: Filed Vitals:   10/29/15 2140 10/29/15 2200 10/29/15 2230 10/30/15 0009  BP:  123/67 126/81   Pulse:  73 74   Temp: 100.7 F (38.2 C)   101.1 F (38.4 C)  TempSrc: Oral   Oral  Resp:  21 23   Height:      Weight:      SpO2:  99% 100%       Constitutional: NAD, calm but ill appearing Filed Vitals:   10/29/15 2140 10/29/15 2200 10/29/15 2230 10/30/15 0009  BP:  123/67 126/81   Pulse:  73 74   Temp: 100.7 F (38.2 C)   101.1 F (38.4 C)  TempSrc: Oral   Oral  Resp:  21 23   Height:      Weight:      SpO2:   99% 100%    Eyes: PERRL, scleral erythema, green purulent drainage from both eyes ENMT: Mucous membranes are dry. Posterior pharynx clear of any exudate or lesions.Normal dentition.  Neck: normal, supple, no masses, no nuchal rigidity Respiratory: clear to auscultation bilaterally, no wheezing, no crackles. Normal respiratory effort. No accessory muscle use.  Cardiovascular: Regular rate and rhythm, no murmurs / rubs / gallops. No extremity edema. 2+ pedal pulses. No carotid bruits.  Abdomen: no tenderness, no masses palpated. Bowel sounds positive.  Musculoskeletal: No joint deformity upper and lower extremities. Good ROM, no contractures. Normal muscle tone.  Skin: no rashes, warm and dry Neurologic: CN 2-12 grossly intact. Sensation intact, Strength 5/5 in all 4.  Psychiatric: Normal judgment and insight. Alert and oriented x 3. Normal mood.     Labs on Admission: I have personally reviewed following labs and imaging studies  CBC:  Recent Labs Lab 10/29/15 1808  WBC 13.6*  HGB 14.1  HCT 40.1  MCV 80.7  PLT 223   Basic Metabolic Panel:  Recent Labs Lab 10/29/15 1808  NA 138  K 3.0*  CL 103  CO2 27  GLUCOSE 125*  BUN 17  CREATININE 1.23  CALCIUM 9.1   GFR: Estimated Creatinine Clearance: 94.4 mL/min (by C-G formula based on Cr of 1.23). Liver Function Tests:  Recent Labs Lab 10/29/15 1808  AST 16  ALT 16*  ALKPHOS 62  BILITOT 1.3*  PROT 8.1  ALBUMIN 4.3    Recent Labs Lab 10/29/15 1808  LIPASE 20   Urine analysis:    Component Value Date/Time   COLORURINE AMBER* 10/29/2015 1936   APPEARANCEUR CLOUDY* 10/29/2015 1936   LABSPEC 1.030 10/29/2015 1936   PHURINE 5.5 10/29/2015 1936   GLUCOSEU NEGATIVE 10/29/2015 1936   HGBUR MODERATE* 10/29/2015 1936   BILIRUBINUR SMALL* 10/29/2015 1936   KETONESUR 40* 10/29/2015 1936   PROTEINUR 100* 10/29/2015 1936   NITRITE NEGATIVE 10/29/2015 1936   LEUKOCYTESUR SMALL* 10/29/2015 1936   Sepsis Labs:    Lactic acid 1.06  Recent Results (from the past 240 hour(s))  Culture, blood (routine x 2)     Status: None (Preliminary result)   Collection Time: 10/29/15  7:27 PM  Result Value Ref Range Status   Specimen Description BLOOD LEFT ARM  Final   Special Requests BOTTLES DRAWN AEROBIC AND ANAEROBIC 5CC  Final   Culture PENDING  Incomplete   Report Status PENDING  Incomplete  Rapid strep screen     Status: None   Collection Time: 10/29/15  7:27 PM  Result Value Ref Range Status   Streptococcus, Group A Screen (Direct) NEGATIVE NEGATIVE Final    Comment: (NOTE) A  Rapid Antigen test may result negative if the antigen level in the sample is below the detection level of this test. The FDA has not cleared this test as a stand-alone test therefore the rapid antigen negative result has reflexed to a Group A Strep culture.   Culture, blood (routine x 2)     Status: None (Preliminary result)   Collection Time: 10/29/15  7:28 PM  Result Value Ref Range Status   Specimen Description BLOOD RIGHT ARM  Final   Special Requests BOTTLES DRAWN AEROBIC AND ANAEROBIC 5CC  Final   Culture PENDING  Incomplete   Report Status PENDING  Incomplete     Radiological Exams on Admission: Dg Chest 2 View  10/29/2015  CLINICAL DATA:  Cough and mid chest pain for 2-3 days EXAM: CHEST  2 VIEW COMPARISON:  None. FINDINGS: The heart size and mediastinal contours are within normal limits. Both lungs are clear. The visualized skeletal structures are unremarkable. IMPRESSION: No active cardiopulmonary disease. Electronically Signed   By: Alcide Clever M.D.   On: 10/29/2015 19:30   Ct Head Wo Contrast  10/29/2015  CLINICAL DATA:  Nausea, vomiting, cough and headaches. EXAM: CT HEAD WITHOUT CONTRAST TECHNIQUE: Contiguous axial images were obtained from the base of the skull through the vertex without intravenous contrast. COMPARISON:  None. FINDINGS: No evidence of parenchymal hemorrhage or extra-axial fluid collection. No  mass lesion, mass effect, or midline shift. No CT evidence of acute infarction. There are coarse plaque like dural calcifications along interhemispheric falx. Cerebral volume is age appropriate. No ventriculomegaly. Mild mucoperiosteal thickening in the bilateral ethmoidal air cells. No fluid levels in the visualized paranasal sinuses. The mastoid air cells are unopacified. No evidence of calvarial fracture. IMPRESSION: 1. No evidence of acute intracranial abnormality. 2. Mild paranasal sinusitis, probably chronic . Electronically Signed   By: Delbert Phenix M.D.   On: 10/29/2015 19:43    EKG: Independently reviewed. NSR, T wave inversion in multiple leads  Assessment/Plan Principal Problem:   UTI (lower urinary tract infection) Active Problems:   Migraine headache   Fever   Leukocytosis   Hypokalemia   Conjunctivitis    UTI with mild sepsis, history of prostatitis --Continue with IV Rocephin; may need to broaden coverage --Blood and urine cultures are pending --Bladder scan to check for urinary retenton --Will add CT A/P stone protocol now --May need urology consult in the AM  Migraine headache --Will try decadron and dilaudid now.  Had reglan and benadryl in the ED without significant improvement.  Hypokalemia --Replacement ordered --Hold HCTZ  Conjunctivitis --Erythromycin drops q6h  History of HTN --Continue ARB with parameters for now   DVT prophylaxis: SCDs Code Status: FULL Family Communication: Wife, son at bedside Disposition Plan: Home when ready Consults called: NONE Admission status: Observation, med surg   Jerene Bears MD [facility]  If 7PM-7AM, please contact night-coverage [email or web address] Password [email or web address]  10/30/2015, 12:12 AM

## 2015-10-30 NOTE — Discharge Instructions (Signed)
Weight Loss Tips General Tips  Eat at least three times per day.  Pay attention to your body. When you feel like you have had enough to eat, stop. Quit before you feel full, stuffed, or sick from eating. You can have more if you are really hungry.  If you still feel hungry or unsatisfied after a meal or snack, wait at least 10 minutes before you have more food. Often, the craving will go away.  Drink plenty of calorie-free drinks (water, tea, coffee, diet soda). You may be thirsty, not hungry.  Pick lean meats, low-fat or nonfat cheese, and skim (nonfat) or 1% fat milk instead of higher-fat/higher-calorie choices.  Get plenty of fiber. Vegetables, fruits, and whole grains are good sources. Have a highfiber cereal every day.  Cut back on sugar. For example, drink less fruit juice and regular soda.  Limit the amount of alcohol (beer, wine, and liquor) that you drink.  Keep all food in the kitchen. Eat only in a chosen place, such as at the table. Dont eat in the car or the bedroom or in front of the TV.  Food Preparation  Plan meals ahead of time.  Try cooking methods that cut calories: o Cook without adding fat (bake, broil, roast, boil). o Use nonstick cooking sprays instead of butter or oil. You can also use wine, broth, or fruit juice instead of oil when cooking. o Use low-calorie foods instead of high-calorie ones when possible.  Adriana Simas only what you need for one meal (dont make leftovers).  If you do make extra portions, put them away as soon as they are ready so you can save them for other meals. Store the leftovers in containers that you cant see through.  Cook when you are not hungry. For example, cook and refrigerate tomorrows dinner after you have finished eating tonight.  Make fruits, vegetables, and other low-calorie foods part of each meal.  Drink water while you cook.  Mealtimes  Drink a glass of water before you eat. Drink more during meals.  Use  smaller plates, bowls, glasses, and serving spoons. Divide your plate into four equal parts. Use one part for meat, one for starch (such as pasta, rice, potatoes, or bread), and two for nonstarchy vegetables.  Do not put serving dishes on the table. This will make it harder to take a second portion.  Put salad dressing on the side instead of mixing it with, or pouring onto your salad. Then dip your fork into the dressing before you spear a bite of salad.  Change your usual place at the table.  Make mealtime special by using pretty dishes, napkins, and glasses.  Eat slowly. Take a few one-minute breaks from eating during meals. Put your fork down between bites. Cut your food one bite at a time.  Enjoy fruit for dessert instead of cake, pie, or other sweets.  Leave a little food on your plate. (You control the food; it doesnt control you.)  Remove your plate as soon as youve finished eating.  If theres no good use for leftovers, throw them out!  Snacking Snacking can be part of your plan for healthy weight loss. You can eat six times per day as long as you plan what to eat and dont eat too many calories.  Plan ahead. Be sure to have healthy snacks on hand. If the right food is not there, you may be more likely to eat whatever is available, such as candy, cookies, chips, leftovers, or  other quick choices.  Keep low-calorie snacks in a special part of the refrigerator. Good choices include the following: o Reduced-fat string cheese, low-calorie yogurt, and nonfat milk. o Washed, bite-size pieces of raw vegetables, such as carrots, celery, pepper strips, cucumbers, broccoli, and cauliflower. Serve with low-calorie dips. o Fresh fruit.  Eating and Emotions Do you use eating to deal with feelings other than hunger, such as boredom, being tired, or stress? If you eat for these reasons, here are some other things you can try:  Call a friend for support.  Use inspirational  quotes to help you avoid the temptation to eat.  Take a warm bath or shower.  Listen to music or a relaxation CD.  Take a walk.  Try activities that keep you from eating. For example, its hard to eat while youre exercising. If you are gardening, you probably wont eat while your hands are covered in soil.

## 2015-10-30 NOTE — Progress Notes (Signed)
Received report .wbb 

## 2015-10-31 ENCOUNTER — Encounter: Payer: Self-pay | Admitting: Internal Medicine

## 2015-10-31 DIAGNOSIS — R509 Fever, unspecified: Secondary | ICD-10-CM

## 2015-10-31 DIAGNOSIS — N390 Urinary tract infection, site not specified: Principal | ICD-10-CM

## 2015-10-31 DIAGNOSIS — E860 Dehydration: Secondary | ICD-10-CM

## 2015-10-31 DIAGNOSIS — G43819 Other migraine, intractable, without status migrainosus: Secondary | ICD-10-CM

## 2015-10-31 DIAGNOSIS — D72829 Elevated white blood cell count, unspecified: Secondary | ICD-10-CM

## 2015-10-31 LAB — URINE CULTURE

## 2015-10-31 LAB — CBC
HCT: 36.8 % — ABNORMAL LOW (ref 39.0–52.0)
Hemoglobin: 12.2 g/dL — ABNORMAL LOW (ref 13.0–17.0)
MCH: 26.8 pg (ref 26.0–34.0)
MCHC: 33.2 g/dL (ref 30.0–36.0)
MCV: 80.9 fL (ref 78.0–100.0)
Platelets: 228 10*3/uL (ref 150–400)
RBC: 4.55 MIL/uL (ref 4.22–5.81)
RDW: 14.5 % (ref 11.5–15.5)
WBC: 15 10*3/uL — ABNORMAL HIGH (ref 4.0–10.5)

## 2015-10-31 LAB — BASIC METABOLIC PANEL
Anion gap: 9 (ref 5–15)
BUN: 19 mg/dL (ref 6–20)
CO2: 24 mmol/L (ref 22–32)
Calcium: 8.4 mg/dL — ABNORMAL LOW (ref 8.9–10.3)
Chloride: 108 mmol/L (ref 101–111)
Creatinine, Ser: 0.97 mg/dL (ref 0.61–1.24)
GFR calc Af Amer: >60 mL/min (ref >60)
GFR calc non Af Amer: >60 mL/min (ref >60)
Glucose, Bld: 150 mg/dL — ABNORMAL HIGH (ref 65–99)
Potassium: 4.2 mmol/L (ref 3.5–5.1)
Sodium: 141 mmol/L (ref 135–145)

## 2015-10-31 MED ORDER — LEVOFLOXACIN 750 MG PO TABS
750.0000 mg | ORAL_TABLET | Freq: Every day | ORAL | Status: AC
Start: 2015-11-01 — End: 2015-11-15

## 2015-10-31 MED ORDER — OLMESARTAN MEDOXOMIL 20 MG PO TABS: 20.0000 mg | ORAL_TABLET | Freq: Every day | ORAL | Status: DC

## 2015-10-31 MED ORDER — LEVOFLOXACIN IN D5W 750 MG/150ML IV SOLN
750.0000 mg | INTRAVENOUS | Status: DC
  Administered 2015-10-31: 750 mg via INTRAVENOUS
  Filled 2015-10-31: qty 150

## 2015-10-31 MED ORDER — DIPHENHYDRAMINE HCL 25 MG PO TABS: 25.0000 mg | ORAL_TABLET | Freq: Four times a day (QID) | ORAL | Status: DC | PRN

## 2015-10-31 NOTE — Progress Notes (Signed)
Pharmacy Antibiotic Note  Carlos Greene is a 50 y.o. male admitted on 10/29/2015 with UTI.  Ceftriaxone started on admission for suspected UTI/prostatitis.  To change abx to levaquin today.  - All cultures negative thus far - Afeb, wbc up 15, scr 0.97 (crcl~100)  Plan: - levaquin 750 mg IV q24h - Pharmacy will sign off. Re-consult us if need further assistance  ___________________________  Height: 5\' 10"  (177.8 cm) Weight: 277 lb 9.6 oz (125.919 kg) IBW/kg (Calculated) : 73  Temp (24hrs), Avg:97.8 F (36.6 C), Min:97.7 F (36.5 C), Max:98.1 F (36.7 C)   Recent Labs Lab 10/29/15 1808 10/29/15 2011 10/30/15 0121 10/31/15 0407  WBC 13.6*  --  11.2* 15.0*  CREATININE 1.23  --  1.11 0.97  LATICACIDVEN  --  1.06  --   --     Estimated Creatinine Clearance: 118.7 mL/min (by C-G formula based on Cr of 0.97).    No Known Allergies  Thank you for allowing pharmacy to be a part of this patient's care.  Carlos Greene, Carlos Greene 10/31/2015 1:07 PM

## 2015-10-31 NOTE — Progress Notes (Signed)
Discharge instructions reviewed with patient and wife. Both verbalized understanding. Patient instructed to follow up with primary physician for continuation of current medication changes.

## 2015-10-31 NOTE — Discharge Summary (Signed)
[facility] Discharge Summary   Patient: Carlos Greene ZOX:[id number]   PCP: Allean Found, MD DOB: [birth year]   Date of admission: 10/29/2015   Date of discharge:  10/31/2015    Discharge Diagnoses:  Principal Problem:   UTI (lower urinary tract infection) Active Problems:   Migraine headache   Fever   Leukocytosis   Hypokalemia   Conjunctivitis   Dehydration   Fever in adult  Recommendations for Outpatient Follow-up:  1. Please follow-up with PCP in one week,  2. Patient will need sleep apnea assessment as an outpatient. 3. Please follow-up with urology in 1-2 weeks for a prostatitis to assess whether you need longer duration of treatment.   Follow-up Information    Follow up with Allean Found, MD. Schedule an appointment as soon as possible for a visit in 1 week.   Specialty:  Family Medicine   Why:  Recheck on prostatitis, need sleep apnea assessment   Contact information:   [address] [place] [address] [phone]       Follow up with [facility]. Schedule an appointment as soon as possible for a visit in 2 weeks.   Why:  for follow up on prostatitis.    Contact information:   [address] [place] [address] [place] [address] [phone]     Diet recommendation: Regular diet  Activity: The patient is advised to gradually reintroduce usual activities.  Discharge Condition: good  History of present illness: As per the H and P dictated on admission, "Carlos Greene is a 50 y.o. gentleman with a history of HTN, migraine headaches, and prior episodes of prostatitis (actively followed by Urology) who presents to the ED for evaluation of several persistent symptoms since Thursday. He has had dysuria, frequency, difficulty initiating urine stream, and his wife has noticed a change in the color of his urine. No significant odor. No associated abdominal, pelvic, or flank pain, but he has had nausea and vomiting  and pain in his lower back. No documented fever until today. His temperature was reportedly 102.7 in his PCP's office; 100.7 here. He has had decreased appetite. He has also had refractory headaches. No associated neck pain or stiffness. No associated neurological deficits. 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 No recent mosquito or tick bites. "  Hospital Course:  Summary of his active problems in the hospital is as following.  Principal Problem:   UTI (lower urinary tract infection) Acute prostatitis. The patient presents with complaints of nausea vomiting and dysuria with fever. He also had intractable headache. He has been having a headache for last 1 week. He was found to have a UTI. Urine culture growing multiple organisms. A CT abdomen pelvis was showing signs of prostatitis without any other acute abnormality. Without any abscess. I discussed with urology on phone and the patient will need a follow-up in 1-2 weeks with urology to decide whether he needs longer duration off treatment. Patient has been able to urinate without any difficulty. Denies any complains of retention or frequency. Patient has mild elevation in WBC as compared to admission, but blood cultures remains negative thus no evidence of sepsis. Antibiotic vise pt was initially on ceftriaxone but due to better prostate penetration, Levaquin has been chosen as antibiotics of choice on discharge. Patient will be given 2 weeks treatment course.  Active Problems:   Migraine headache Currently resolved. Recommendations resume sumatriptan and uses home medications. Follow-up with PCP.  Benadryl when necessary for headache.    Hypokalemia Resolved.   Needs sleep apnea assessment. Patient complains of heavy snoring as well as difficulty sleeping requiring to sleep in recliner. atient also complains of increased sleepiness during the day. With worsening  headache. These signs suggest patient has probable sleep apnea and would benefit from sleep study assessment. Recommend PCP to order as an outpatient.   Essential hypertension. Patient's blood pressure remains stable. This is despite holding his medication. Therefore on discharge I would reduce the dose of on his heart and 20 mg daily. Also discontinue HCTZ component on discharge. Patient will follow-up with PCP regarding further changes in blood pressure medication.  All other chronic medical condition were stable during the hospitalization.  Patient was ambulatory without any assistance. On the day of the discharge the patient's vitals were stable, and no other acute medical condition were reported by patient. the patient was felt safe to be discharge at home with family.  Procedures and Results:  none   Consultations:  urology  DISCHARGE MEDICATION: Current Discharge Medication List    START taking these medications   Details  diphenhydrAMINE (BENADRYL) 25 MG tablet Take 1 tablet (25 mg total) by mouth every 6 (six) hours as needed (Use for migraine, call PCP if no improvement after 2 tablets). Qty: 30 tablet, Refills: 0    levofloxacin (LEVAQUIN) 750 MG tablet Take 1 tablet (750 mg total) by mouth daily. Qty: 13 tablet, Refills: 0    olmesartan (BENICAR) 20 MG tablet Take 1 tablet (20 mg total) by mouth daily. Qty: 30 tablet, Refills: 0      CONTINUE these medications which have NOT CHANGED   Details  acetaminophen (TYLENOL) 325 MG tablet Take 650 mg by mouth every 6 (six) hours as needed for fever.    aspirin EC 81 MG tablet Take 81 mg by mouth daily.    HYDROcodone-acetaminophen (NORCO/VICODIN) 5-325 MG tablet Take 1 tablet by mouth every 6 (six) hours as needed for moderate pain or severe pain (migraine pain).     SUMAtriptan (IMITREX) 100 MG tablet Take 100 mg by mouth every 2 (two) hours as needed for migraine.     atorvastatin (LIPITOR) 20 MG tablet Take 20  mg by mouth daily. Reported on 10/29/2015    zolpidem (AMBIEN) 10 MG tablet Take 10 mg by mouth at bedtime.       STOP taking these medications     olmesartan-hydrochlorothiazide (BENICAR HCT) 40-25 MG tablet        No Known Allergies Discharge Instructions    Ambulatory referral to Sleep Studies    Complete by:  As directed      Diet general    Complete by:  As directed      Discharge instructions    Complete by:  As directed   It is important that you read following instructions as well as go over your medication list with RN to help you understand your care after this hospitalization.  Discharge Instructions: Please follow-up with PCP in one week  Please request your primary care physician to go over all Hospital Tests and Procedure/Radiological results at the follow up,  Please get all Hospital records sent to your PCP by signing hospital release before you go home.   Do not take more than prescribed Pain, Sleep and Anxiety Medications. You were cared for by a hospitalist during your hospital stay. If you have any questions about your discharge medications or the care you received while you  were in the hospital after you are discharged, you can call the unit and ask to speak with the hospitalist on call if the hospitalist that took care of you is not available.  Once you are discharged, your primary care physician will handle any further medical issues. Please note that NO REFILLS for any discharge medications will be authorized once you are discharged, as it is imperative that you return to your primary care physician (or establish a relationship with a primary care physician if you do not have one) for your aftercare needs so that they can reassess your need for medications and monitor your lab values. You Must read complete instructions/literature along with all the possible adverse reactions/side effects for all the Medicines you take and that have been prescribed to you. Take any  new Medicines after you have completely understood and accept all the possible adverse reactions/side effects. Wear Seat belts while driving. If you have smoked or chewed Tobacco in the last 2 yrs please stop smoking and/or stop any Recreational drug use.     Encourage fluids    Complete by:  As directed      Increase activity slowly    Complete by:  As directed           Discharge Exam: Filed Weights   10/29/15 1936 10/29/15 1945 10/30/15 0125  Weight: 127.914 kg (282 lb) 127.914 kg (282 lb) 125.919 kg (277 lb 9.6 oz)   Filed Vitals:   10/30/15 2130 10/31/15 0545  BP: 120/66 109/55  Pulse: 59 51  Temp: 97.7 F (36.5 C) 97.7 F (36.5 C)  Resp:     General: Appear in no distress, no Rash; Oral Mucosa moist. Cardiovascular: S1 and S2 Present, no Murmur, no JVD Respiratory: Bilateral Air entry present and Clear to Auscultation, no Crackles, no wheezes Abdomen: Bowel Sound present, Soft and no tenderness Extremities: no Pedal edema, no calf tenderness Neurology: Grossly no focal neuro deficit.  The results of significant diagnostics from this hospitalization (including imaging, microbiology, ancillary and laboratory) are listed below for reference.    Significant Diagnostic Studies: Ct Abdomen Pelvis Wo Contrast  10/30/2015  CLINICAL DATA:  Fever.  UTI and prostatitis. EXAM: CT ABDOMEN AND PELVIS WITHOUT CONTRAST TECHNIQUE: Multidetector CT imaging of the abdomen and pelvis was performed following the standard protocol without IV contrast. COMPARISON:  None. FINDINGS: Lower chest and abdominal wall:  No contributory findings. Hepatobiliary: No focal liver abnormality.No evidence of biliary obstruction or stone. Pancreas: Unremarkable. Spleen: Unremarkable. Adrenals/Urinary Tract: Negative adrenals. No hydronephrosis or stone. No asymmetric stranding or nephromegaly. Unremarkable bladder. Reproductive:Haziness around a mildly enlarged prostate correlating with history of prostatitis. No  asymmetry or focal finding to suggest abscess. Stomach/Bowel:  No obstruction. No appendicitis. Vascular/Lymphatic: No acute vascular abnormality. No mass or adenopathy. Peritoneal: No ascites or pneumoperitoneum. Musculoskeletal: No acute abnormalities. IMPRESSION: 1. Pelvic stranding correlating with history prostatitis. No noncontrast evidence of abscess. 2. Normal appearance of the kidneys. Electronically Signed   By: Marnee Spring M.D.   On: 10/30/2015 04:58   Dg Chest 2 View  10/29/2015  CLINICAL DATA:  Cough and mid chest pain for 2-3 days EXAM: CHEST  2 VIEW COMPARISON:  None. FINDINGS: The heart size and mediastinal contours are within normal limits. Both lungs are clear. The visualized skeletal structures are unremarkable. IMPRESSION: No active cardiopulmonary disease. Electronically Signed   By: Alcide Clever M.D.   On: 10/29/2015 19:30   Ct Head Wo Contrast  10/29/2015  CLINICAL DATA:  Nausea, vomiting, cough and headaches. EXAM: CT HEAD WITHOUT CONTRAST TECHNIQUE: Contiguous axial images were obtained from the base of the skull through the vertex without intravenous contrast. COMPARISON:  None. FINDINGS: No evidence of parenchymal hemorrhage or extra-axial fluid collection. No mass lesion, mass effect, or midline shift. No CT evidence of acute infarction. There are coarse plaque like dural calcifications along interhemispheric falx. Cerebral volume is age appropriate. No ventriculomegaly. Mild mucoperiosteal thickening in the bilateral ethmoidal air cells. No fluid levels in the visualized paranasal sinuses. The mastoid air cells are unopacified. No evidence of calvarial fracture. IMPRESSION: 1. No evidence of acute intracranial abnormality. 2. Mild paranasal sinusitis, probably chronic . Electronically Signed   By: Delbert Phenix M.D.   On: 10/29/2015 19:43    Microbiology: Recent Results (from the past 240 hour(s))  Culture, blood (routine x 2)     Status: None (Preliminary result)    Collection Time: 10/29/15  7:27 PM  Result Value Ref Range Status   Specimen Description BLOOD LEFT ARM  Final   Special Requests BOTTLES DRAWN AEROBIC AND ANAEROBIC 5CC  Final   Culture   Final    NO GROWTH 2 DAYS Performed at [facility]    Report Status PENDING  Incomplete  Rapid strep screen     Status: None   Collection Time: 10/29/15  7:27 PM  Result Value Ref Range Status   Streptococcus, Group A Screen (Direct) NEGATIVE NEGATIVE Final    Comment: (NOTE) A Rapid Antigen test may result negative if the antigen level in the sample is below the detection level of this test. The FDA has not cleared this test as a stand-alone test therefore the rapid antigen negative result has reflexed to a Group A Strep culture.   Culture, group A strep     Status: None (Preliminary result)   Collection Time: 10/29/15  7:27 PM  Result Value Ref Range Status   Specimen Description THROAT  Final   Special Requests NONE  Final   Culture   Final    CULTURE REINCUBATED FOR BETTER GROWTH Performed at [facility]    Report Status PENDING  Incomplete  Culture, blood (routine x 2)     Status: None (Preliminary result)   Collection Time: 10/29/15  7:28 PM  Result Value Ref Range Status   Specimen Description BLOOD RIGHT ARM  Final   Special Requests BOTTLES DRAWN AEROBIC AND ANAEROBIC 5CC  Final   Culture   Final    NO GROWTH 2 DAYS Performed at [facility]    Report Status PENDING  Incomplete  Urine culture     Status: Abnormal   Collection Time: 10/29/15  7:36 PM  Result Value Ref Range Status   Specimen Description URINE, CLEAN CATCH  Final   Special Requests NONE  Final   Culture MULTIPLE SPECIES PRESENT, SUGGEST RECOLLECTION (A)  Final   Report Status 10/31/2015 FINAL  Final     Labs: CBC:  Recent Labs Lab 10/29/15 1808 10/30/15 0121 10/31/15 0407  WBC 13.6* 11.2* 15.0*  HGB 14.1 13.1 12.2*  HCT 40.1 39.2 36.8*  MCV 80.7 81.3 80.9  PLT 223 204 228    Basic Metabolic Panel:  Recent Labs Lab 10/29/15 1808 10/30/15 0121 10/31/15 0407  NA 138 138 141  K 3.0* 3.3* 4.2  CL 103 105 108  CO2 GLUCOSE 125* 173* 150*  BUN CREATININE 1.23 1.11 0.97  CALCIUM 9.1 8.5*  8.4*   Liver Function Tests:  Recent Labs Lab 10/29/15 1808  AST 16  ALT 16*  ALKPHOS 62  BILITOT 1.3*  PROT 8.1  ALBUMIN 4.3    Recent Labs Lab 10/29/15 1808  LIPASE 20   Time spent: 30 minutes  Signed:  PATEL, PRANAV  [facility]  10/31/2015  , 5:25 PM

## 2015-11-01 LAB — URINE CULTURE: Culture: NO GROWTH

## 2015-11-01 LAB — CULTURE, GROUP A STREP (THRC)

## 2015-11-03 LAB — CULTURE, BLOOD (ROUTINE X 2)
Culture: NO GROWTH
Culture: NO GROWTH

## 2016-01-06 ENCOUNTER — Encounter (HOSPITAL_BASED_OUTPATIENT_CLINIC_OR_DEPARTMENT_OTHER): Payer: Self-pay | Admitting: Emergency Medicine

## 2016-01-06 ENCOUNTER — Emergency Department (HOSPITAL_BASED_OUTPATIENT_CLINIC_OR_DEPARTMENT_OTHER)
Admission: EM | Admit: 2016-01-06 | Discharge: 2016-01-06 | Disposition: A | Discharge status: Home / Self Care | Payer: BLUE CROSS/BLUE SHIELD | Attending: Emergency Medicine | Admitting: Emergency Medicine

## 2016-01-06 DIAGNOSIS — Z79899 Other long term (current) drug therapy: Secondary | ICD-10-CM | POA: Diagnosis not present

## 2016-01-06 DIAGNOSIS — I10 Essential (primary) hypertension: Secondary | ICD-10-CM | POA: Insufficient documentation

## 2016-01-06 DIAGNOSIS — K222 Esophageal obstruction: Secondary | ICD-10-CM | POA: Insufficient documentation

## 2016-01-06 DIAGNOSIS — R1310 Dysphagia, unspecified: Secondary | ICD-10-CM | POA: Diagnosis present

## 2016-01-06 MED ORDER — OMEPRAZOLE 2 MG/ML ORAL SUSPENSION: 20.0000 mg | Freq: Two times a day (BID) | ORAL | 0 refills | Status: DC

## 2016-01-06 MED ORDER — LORAZEPAM 2 MG/ML IJ SOLN
2.0000 mg | Freq: Once | INTRAMUSCULAR | Status: DC
  Filled 2016-01-06: qty 1

## 2016-01-06 MED ORDER — FAMOTIDINE 40 MG/5ML PO SUSR
40.0000 mg | Freq: Once | ORAL | Status: DC
  Filled 2016-01-06: qty 5

## 2016-01-06 MED ORDER — LORAZEPAM 1 MG PO TABS: 1.0000 mg | ORAL_TABLET | Freq: Three times a day (TID) | ORAL | 0 refills | Status: DC | PRN

## 2016-01-06 MED ORDER — LORAZEPAM 2 MG/ML IJ SOLN
2.0000 mg | Freq: Once | INTRAMUSCULAR | Status: AC
  Administered 2016-01-06: 2 mg via INTRAMUSCULAR
  Filled 2016-01-06: qty 1

## 2016-01-06 NOTE — ED Triage Notes (Signed)
Pt in c/o difficulty swallowing although is able to successfully swallow. States has had this problem before and is related to stricture. Alert, interactive, airway intact, in NAD.

## 2016-01-06 NOTE — Discharge Instructions (Signed)
Liquids only tonight.  Call Dr.Shearin's office in the morning for a same-day appointment.

## 2016-01-06 NOTE — ED Triage Notes (Signed)
Pt called and family member states he is in restroom.

## 2016-01-08 NOTE — ED Provider Notes (Signed)
[facility]-EMERGENCY DEPT Provider Note   CSN: [id number] Arrival date & time: 01/06/16  1438     History   Chief Complaint Chief Complaint  Patient presents with  . Dysphagia    HPI Carlos Greene is a 50 y.o. male. He is history of esophageal stricture. He's had undergo dilatation the past. He has not seen his [facility] neurologist for 5 years. His become symptomatic over the last week with some foods "hanging out" as he swallows. He now feels anxious. However he swallows he feels like "my throat and a close often and choked me". He is swallowing liquids without difficulty. He actually had chicken yesterday without difficulty. He does not feel as though he has a constant sensation of foreign body is not drooling is not vomiting. HPI  Past Medical History:  Diagnosis Date  . Colon polyp   . Hypercholesteremia   . Hypertension   . Prostatitis     Patient Active Problem List   Diagnosis Date Noted  . Dehydration   . Fever in adult   . UTI (lower urinary tract infection) 10/29/2015  . Migraine headache 10/29/2015  . Fever 10/29/2015  . Leukocytosis 10/29/2015  . Hypokalemia 10/29/2015  . Conjunctivitis 10/29/2015    Past Surgical History:  Procedure Laterality Date  . CARDIAC CATHETERIZATION  1995, 2011       Home Medications    Prior to Admission medications   Medication Sig Start Date End Date Taking? Authorizing Provider  acetaminophen (TYLENOL) 325 MG tablet Take 650 mg by mouth every 6 (six) hours as needed for fever.    Historical Provider, MD  aspirin EC 81 MG tablet Take 81 mg by mouth daily.    Historical Provider, MD  atorvastatin (LIPITOR) 20 MG tablet Take 20 mg by mouth daily. Reported on 10/29/2015 10/25/15   Historical Provider, MD  diphenhydrAMINE (BENADRYL) 25 MG tablet Take 1 tablet (25 mg total) by mouth every 6 (six) hours as needed (Use for migraine, call PCP if no improvement after 2 tablets). 10/31/15   Rolly SalterPranav M Patel, MD  HYDROcodone-acetaminophen  (NORCO/VICODIN) 5-325 MG tablet Take 1 tablet by mouth every 6 (six) hours as needed for moderate pain or severe pain (migraine pain).  10/25/15   Historical Provider, MD  olmesartan (BENICAR) 20 MG tablet Take 1 tablet (20 mg total) by mouth daily. 10/31/15   Rolly SalterPranav M Patel, MD  SUMAtriptan (IMITREX) 100 MG tablet Take 100 mg by mouth every 2 (two) hours as needed for migraine.  10/25/15   Historical Provider, MD  zolpidem (AMBIEN) 10 MG tablet Take 10 mg by mouth at bedtime.  10/25/15   Historical Provider, MD    Family History Family History  Problem Relation Age of Onset  . Brain cancer Mother   . Diabetes Father   . Stroke Father     Social History Social History  Substance Use Topics  . Smoking status: Never Smoker  . Smokeless tobacco: Not on file  . Alcohol use Yes     Comment: 1x/year     Allergies   Review of patient's allergies indicates no known allergies.   Review of Systems Review of Systems  Constitutional: Negative for appetite change, chills, diaphoresis, fatigue and fever.  HENT: Negative for mouth sores, sore throat and trouble swallowing.   Eyes: Negative for visual disturbance.  Respiratory: Negative for cough, chest tightness, shortness of breath and wheezing.   Cardiovascular: Negative for chest pain.  Gastrointestinal: Negative for abdominal distention, abdominal pain, diarrhea, nausea  and vomiting.       Sensation of dysphasia in the upper chest lower neck.  Endocrine: Negative for polydipsia, polyphagia and polyuria.  Genitourinary: Negative for dysuria, frequency and hematuria.  Musculoskeletal: Negative for gait problem.  Skin: Negative for color change, pallor and rash.  Neurological: Negative for dizziness, syncope, light-headedness and headaches.  Hematological: Does not bruise/bleed easily.  Psychiatric/Behavioral: Negative for behavioral problems and confusion.     Physical Exam Updated Vital Signs BP 168/99 (BP Location: Left Arm)   Pulse  (!) 54   Temp 97.8 F (36.6 C) (Oral)   Resp 18   Ht 5\' 10"  (1.778 m)   Wt 286 lb (129.7 kg)   SpO2 100%   BMI 41.04 kg/m   Physical Exam  Constitutional: He is oriented to person, place, and time. He appears well-developed and well-nourished. No distress.  Anxious pacing adult male. He is drinking water in the room without regurgitation or drooling. Normal. No postpharyngeal swelling. Clear lungs. Benign abdomen.  HENT:  Head: Normocephalic.  Eyes: Conjunctivae are normal. Pupils are equal, round, and reactive to light. No scleral icterus.  Neck: Normal range of motion. Neck supple. No thyromegaly present.  Cardiovascular: Normal rate and regular rhythm.  Exam reveals no gallop and no friction rub.   No murmur heard. Pulmonary/Chest: Effort normal and breath sounds normal. No respiratory distress. He has no wheezes. He has no rales.  Abdominal: Soft. Bowel sounds are normal. He exhibits no distension. There is no tenderness. There is no rebound.  Musculoskeletal: Normal range of motion.  Neurological: He is alert and oriented to person, place, and time.  Skin: Skin is warm and dry. No rash noted.  Psychiatric: He has a normal mood and affect. His behavior is normal.     ED Treatments / Results  Labs (all labs ordered are listed, but only abnormal results are displayed) Labs Reviewed - No data to display  EKG  EKG Interpretation None       Radiology No results found.  Procedures Procedures (including critical care time)  Medications Ordered in ED Medications  LORazepam (ATIVAN) injection 2 mg (2 mg Intramuscular Given 01/06/16 1614)     Initial Impression / Assessment and Plan / ED Course  I have reviewed the triage vital signs and the nursing notes.  Pertinent labs & imaging results that were available during my care of the patient were reviewed by me and considered in my medical decision making (see chart for details).  Clinical Course    Patient anxious.  However has a normal physical examination. He is taking liquids without difficulty. I discussed the case with the GI physician on call [facility]. They have open slots available and can see him tomorrow to discuss repeat heat esophageal dilatation  Final Clinical Impressions(s) / ED Diagnoses   Final diagnoses:  Esophageal stricture    New Prescriptions Discharge Medication List as of 01/06/2016  3:43 PM    START taking these medications   Details  LORazepam (ATIVAN) 1 MG tablet Take 1 tablet (1 mg total) by mouth 3 (three) times daily as needed for anxiety (May crush before taking)., Starting Sun 01/06/2016, Print    omeprazole (PRILOSEC) 2 mg/mL SUSP Take 10 mLs (20 mg total) by mouth 2 (two) times daily before a meal., Starting Sun 01/06/2016, Print         Rolland PorterMark Tylie Golonka, MD 01/08/16 1130

## 2016-01-24 ENCOUNTER — Emergency Department (HOSPITAL_BASED_OUTPATIENT_CLINIC_OR_DEPARTMENT_OTHER): Payer: BLUE CROSS/BLUE SHIELD

## 2016-01-24 ENCOUNTER — Emergency Department (HOSPITAL_BASED_OUTPATIENT_CLINIC_OR_DEPARTMENT_OTHER)
Admission: EM | Admit: 2016-01-24 | Discharge: 2016-01-24 | Disposition: A | Discharge status: Home / Self Care | Payer: BLUE CROSS/BLUE SHIELD | Attending: Emergency Medicine | Admitting: Emergency Medicine

## 2016-01-24 ENCOUNTER — Encounter (HOSPITAL_BASED_OUTPATIENT_CLINIC_OR_DEPARTMENT_OTHER): Payer: Self-pay | Admitting: *Deleted

## 2016-01-24 DIAGNOSIS — F458 Other somatoform disorders: Secondary | ICD-10-CM | POA: Diagnosis not present

## 2016-01-24 DIAGNOSIS — R0989 Other specified symptoms and signs involving the circulatory and respiratory systems: Secondary | ICD-10-CM

## 2016-01-24 DIAGNOSIS — Z79899 Other long term (current) drug therapy: Secondary | ICD-10-CM | POA: Diagnosis not present

## 2016-01-24 DIAGNOSIS — Z7982 Long term (current) use of aspirin: Secondary | ICD-10-CM | POA: Diagnosis not present

## 2016-01-24 DIAGNOSIS — I10 Essential (primary) hypertension: Secondary | ICD-10-CM | POA: Diagnosis not present

## 2016-01-24 DIAGNOSIS — R198 Other specified symptoms and signs involving the digestive system and abdomen: Secondary | ICD-10-CM

## 2016-01-24 MED ORDER — LORAZEPAM 2 MG/ML IJ SOLN
2.0000 mg | Freq: Once | INTRAMUSCULAR | Status: AC
  Administered 2016-01-24: 2 mg via INTRAMUSCULAR
  Filled 2016-01-24: qty 1

## 2016-01-24 MED ORDER — LORAZEPAM 1 MG PO TABS: 1.0000 mg | ORAL_TABLET | Freq: Three times a day (TID) | ORAL | 0 refills | Status: DC | PRN

## 2016-01-24 MED ORDER — LABETALOL HCL 5 MG/ML IV SOLN: 20.0000 mg | Freq: Once | INTRAVENOUS | Status: DC

## 2016-01-24 MED ORDER — METHOCARBAMOL 500 MG PO TABS: 500.0000 mg | ORAL_TABLET | Freq: Once | ORAL | Status: DC

## 2016-01-24 NOTE — ED Provider Notes (Signed)
[facility]   CSN: [id number] Arrival date & time: 01/24/16  0027     History   Chief Complaint Chief Complaint  Patient presents with  . Choking    HPI Carlos Greene is a 50 y.o. male was seen in the ED on the 13th of this month complaining of difficulty swallowing and a sensation of something being in his throat. He reported a history of esophageal stricture requiring dilatation. The patient was seen by a gastroenterologist 2 days later and had an upper endoscopy. This endoscopy did not show an esophageal stricture but there was concern for achalasia and he did undergo dilated. A gastric polyp was found and biopsied at that time.  He is here complaining of a sensation of a tightening or choking sensation in his throat. He states it is difficult to swallow although he is able to swallow and ate soup with crackers earlier. This sensation is worse when he lies supine, and causes significant anxiety, so he has been sleeping sitting up. He has not been vomiting and he is able to swallow his secretions.  He is taking omeprazole 40 milligrams twice daily. When he was seen on the 13th he was given a shot of lorazepam which significantly improved his symptoms.  HPI  Past Medical History:  Diagnosis Date  . Colon polyp   . Hypercholesteremia   . Hypertension   . Prostatitis     Patient Active Problem List   Diagnosis Date Noted  . Dehydration   . Fever in adult   . UTI (lower urinary tract infection) 10/29/2015  . Migraine headache 10/29/2015  . Fever 10/29/2015  . Leukocytosis 10/29/2015  . Hypokalemia 10/29/2015  . Conjunctivitis 10/29/2015    Past Surgical History:  Procedure Laterality Date  . CARDIAC CATHETERIZATION  1995, 2011       Home Medications    Prior to Admission medications   Medication Sig Start Date End Date Taking? Authorizing Provider  omeprazole (PRILOSEC) 40 MG capsule Take 40 mg by mouth 2 (two) times daily.   Yes  Historical Provider, MD  acetaminophen (TYLENOL) 325 MG tablet Take 650 mg by mouth every 6 (six) hours as needed for fever.    Historical Provider, MD  aspirin EC 81 MG tablet Take 81 mg by mouth daily.    Historical Provider, MD  atorvastatin (LIPITOR) 20 MG tablet Take 20 mg by mouth daily. Reported on 10/29/2015 10/25/15   Historical Provider, MD  diphenhydrAMINE (BENADRYL) 25 MG tablet Take 1 tablet (25 mg total) by mouth every 6 (six) hours as needed (Use for migraine, call PCP if no improvement after 2 tablets). 10/31/15   Rolly Salter, MD  HYDROcodone-acetaminophen (NORCO/VICODIN) 5-325 MG tablet Take 1 tablet by mouth every 6 (six) hours as needed for moderate pain or severe pain (migraine pain).  10/25/15   Historical Provider, MD  olmesartan (BENICAR) 20 MG tablet Take 1 tablet (20 mg total) by mouth daily. 10/31/15   Rolly Salter, MD  SUMAtriptan (IMITREX) 100 MG tablet Take 100 mg by mouth every 2 (two) hours as needed for migraine.  10/25/15   Historical Provider, MD  zolpidem (AMBIEN) 10 MG tablet Take 10 mg by mouth at bedtime.  10/25/15   Historical Provider, MD    Family History Family History  Problem Relation Age of Onset  . Brain cancer Mother   . Diabetes Father   . Stroke Father     Social History Social History  Substance  Use Topics  . Smoking status: Never Smoker  . Smokeless tobacco: Not on file  . Alcohol use Yes     Comment: 1x/year     Allergies   Review of patient's allergies indicates no known allergies.   Review of Systems Review of Systems  All other systems reviewed and are negative.   Physical Exam Updated Vital Signs BP 136/84 (BP Location: Right Arm)   Pulse 60   Temp 97.8 F (36.6 C) (Oral)   Resp 18   Wt 283 lb (128.4 kg)   SpO2 98%   BMI 40.61 kg/m   Physical Exam General: Well-developed, well-nourished male in no acute distress; appearance consistent with age of record HENT: normocephalic; atraumatic; no pharyngeal erythema, exudate  or edema; no stridor; no dysphonia; observed swallowing water without difficulty Eyes: pupils equal, round and reactive to light; extraocular muscles intact Neck: supple Heart: regular rate and rhythm Lungs: clear to auscultation bilaterally Abdomen: soft; nondistended; nontender; bowel sounds present Extremities: No deformity; full range of motion; pulses normal Neurologic: Awake, alert and oriented; motor function intact in all extremities and symmetric; no facial droop Skin: Warm and dry Psychiatric: Anxious    ED Treatments / Results  Nursing notes and vitals signs, including pulse oximetry, reviewed.  Summary of this visit's results, reviewed by myself:  Imaging Studies: Dg Neck Soft Tissue  Result Date: 01/24/2016 CLINICAL DATA:  Worsening swallowing dysfunction after esophageal dilatation 3 weeks ago. EXAM: NECK SOFT TISSUES - 1+ VIEW COMPARISON:  None. FINDINGS: There is no evidence of retropharyngeal soft tissue swelling or epiglottic enlargement. The cervical airway is unremarkable and no radio-opaque foreign body identified. IMPRESSION: Negative. Electronically Signed   By: Ellery Plunkaniel R Mitchell M.D.   On: 01/24/2016 02:18   2:31 AM Patient feeling better after IM lorazepam. I suspect there is an element of globus hystericus involved. This is a diagnosis of exclusion and he was encouraged to follow-up with his gastroenterologist, Dr. Lanae BoastShearin.  Procedures (including critical care time)   Final Clinical Impressions(s) / ED Diagnoses   Final diagnoses:  Globus sensation      Paula LibraJohn Nichoals Heyde, MD 01/24/16 [id number]

## 2016-01-24 NOTE — ED Triage Notes (Signed)
Pt c/o " im choking" x 1 day, recent procedure for esophagus stricture x 1 week ago

## 2016-02-03 ENCOUNTER — Encounter (HOSPITAL_BASED_OUTPATIENT_CLINIC_OR_DEPARTMENT_OTHER): Payer: Self-pay | Admitting: Emergency Medicine

## 2016-02-03 ENCOUNTER — Emergency Department (HOSPITAL_BASED_OUTPATIENT_CLINIC_OR_DEPARTMENT_OTHER): Payer: BLUE CROSS/BLUE SHIELD

## 2016-02-03 ENCOUNTER — Emergency Department (HOSPITAL_BASED_OUTPATIENT_CLINIC_OR_DEPARTMENT_OTHER)
Admission: EM | Admit: 2016-02-03 | Discharge: 2016-02-04 | Disposition: A | Discharge status: Home / Self Care | Payer: BLUE CROSS/BLUE SHIELD | Attending: Emergency Medicine | Admitting: Emergency Medicine

## 2016-02-03 DIAGNOSIS — R0789 Other chest pain: Secondary | ICD-10-CM | POA: Diagnosis not present

## 2016-02-03 DIAGNOSIS — R519 Headache, unspecified: Secondary | ICD-10-CM

## 2016-02-03 DIAGNOSIS — R51 Headache: Secondary | ICD-10-CM | POA: Diagnosis present

## 2016-02-03 DIAGNOSIS — Z79899 Other long term (current) drug therapy: Secondary | ICD-10-CM | POA: Insufficient documentation

## 2016-02-03 DIAGNOSIS — I10 Essential (primary) hypertension: Secondary | ICD-10-CM | POA: Insufficient documentation

## 2016-02-03 DIAGNOSIS — Z7982 Long term (current) use of aspirin: Secondary | ICD-10-CM | POA: Insufficient documentation

## 2016-02-03 DIAGNOSIS — E049 Nontoxic goiter, unspecified: Secondary | ICD-10-CM | POA: Diagnosis not present

## 2016-02-03 LAB — CBC
HCT: 45.6 % (ref 39.0–52.0)
Hemoglobin: 15.8 g/dL (ref 13.0–17.0)
MCH: 26.9 pg (ref 26.0–34.0)
MCHC: 34.6 g/dL (ref 30.0–36.0)
MCV: 77.7 fL — ABNORMAL LOW (ref 78.0–100.0)
Platelets: 238 10*3/uL (ref 150–400)
RBC: 5.87 MIL/uL — ABNORMAL HIGH (ref 4.22–5.81)
RDW: 14.1 % (ref 11.5–15.5)
WBC: 4 10*3/uL (ref 4.0–10.5)

## 2016-02-03 LAB — BASIC METABOLIC PANEL
Anion gap: 12 (ref 5–15)
BUN: 11 mg/dL (ref 6–20)
CO2: 29 mmol/L (ref 22–32)
Calcium: 9.9 mg/dL (ref 8.9–10.3)
Chloride: 95 mmol/L — ABNORMAL LOW (ref 101–111)
Creatinine, Ser: 1.15 mg/dL (ref 0.61–1.24)
GFR calc Af Amer: >60 mL/min (ref >60)
GFR calc non Af Amer: >60 mL/min (ref >60)
Glucose, Bld: 126 mg/dL — ABNORMAL HIGH (ref 65–99)
Potassium: 3 mmol/L — ABNORMAL LOW (ref 3.5–5.1)
Sodium: 136 mmol/L (ref 135–145)

## 2016-02-03 LAB — TROPONIN I: Troponin I: <0.03 ng/mL (ref <0.03)

## 2016-02-03 MED ORDER — IOPAMIDOL (ISOVUE-300) INJECTION 61%
100.0000 mL | Freq: Once | INTRAVENOUS | Status: AC | PRN
  Administered 2016-02-03: 100 mL via INTRAVENOUS

## 2016-02-03 MED ORDER — DIPHENHYDRAMINE HCL 50 MG/ML IJ SOLN
25.0000 mg | Freq: Once | INTRAMUSCULAR | Status: AC
  Administered 2016-02-03: 25 mg via INTRAVENOUS
  Filled 2016-02-03: qty 1

## 2016-02-03 MED ORDER — METOCLOPRAMIDE HCL 5 MG/ML IJ SOLN
10.0000 mg | Freq: Once | INTRAMUSCULAR | Status: AC
  Administered 2016-02-03: 10 mg via INTRAVENOUS
  Filled 2016-02-03: qty 2

## 2016-02-03 NOTE — ED Provider Notes (Signed)
[facility] Provider Note: Lowella Dell, MD, FACEP  CSN: [id number] MRN: [id number] ARRIVAL: 02/03/16 at 2150 By signing my name below, I, Octavia Heir, attest that this documentation has been prepared under the direction and in the presence of Paula Libra, MD.  Electronically Signed: Octavia Heir, ED Scribe. 02/03/16. 11:18 PM.  CHIEF COMPLAINT  Headache   HISTORY OF PRESENT ILLNESS  Carlos Greene is a 50 y.o. male who has a PMhx of HTN and hypercholesteremia presents to the Emergency Department complaining of sudden onset, gradual worsening, moderate headache x 1 week. He reports associated light-headedness, diaphoresis, bilateral "aching eyes" and chest tightness x 2 hours. He further notes losing ~ 26 lbs due to being on a liquid diet. Pt has a hx of migraines but notes this is different because of his eye pain. He says he checked BP tonight and it was 142/91. He was just started on amlodipine on Friday after being taken off of benicar last week. Pt has taken hydrocodone to alleviate his headache with no relief. Pt denies nausea, vomiting, diarrhea, fever, chills or shortness of breath.   Past Medical History:  Diagnosis Date  . Colon polyp   . Hypercholesteremia   . Hypertension   . Prostatitis     Past Surgical History:  Procedure Laterality Date  . CARDIAC CATHETERIZATION  1995, 2011    Family History  Problem Relation Age of Onset  . Brain cancer Mother   . Diabetes Father   . Stroke Father     Social History  Substance Use Topics  . Smoking status: Never Smoker  . Smokeless tobacco: Never Used  . Alcohol use Yes     Comment: 1x/year    Prior to Admission medications   Medication Sig Start Date End Date Taking? Authorizing Provider  amLODipine (NORVASC) 5 MG tablet Take 5 mg by mouth daily.   Yes Historical Provider, MD  hydrochlorothiazide (HYDRODIURIL) 25 MG tablet Take 25 mg by mouth daily.   Yes Historical Provider, MD  acetaminophen  (TYLENOL) 325 MG tablet Take 650 mg by mouth every 6 (six) hours as needed for fever.    Historical Provider, MD  aspirin EC 81 MG tablet Take 81 mg by mouth daily.    Historical Provider, MD  atorvastatin (LIPITOR) 20 MG tablet Take 20 mg by mouth daily. Reported on 10/29/2015 10/25/15   Historical Provider, MD  diphenhydrAMINE (BENADRYL) 25 MG tablet Take 1 tablet (25 mg total) by mouth every 6 (six) hours as needed (Use for migraine, call PCP if no improvement after 2 tablets). 10/31/15   Rolly Salter, MD  HYDROcodone-acetaminophen (NORCO/VICODIN) 5-325 MG tablet Take 1 tablet by mouth every 6 (six) hours as needed for moderate pain or severe pain (migraine pain).  10/25/15   Historical Provider, MD  LORazepam (ATIVAN) 1 MG tablet Take 1 tablet (1 mg total) by mouth 3 (three) times daily as needed for anxiety. 01/24/16   Chamia Schmutz, MD  omeprazole (PRILOSEC) 40 MG capsule Take 40 mg by mouth 2 (two) times daily.    Historical Provider, MD  SUMAtriptan (IMITREX) 100 MG tablet Take 100 mg by mouth every 2 (two) hours as needed for migraine.  10/25/15   Historical Provider, MD  zolpidem (AMBIEN) 10 MG tablet Take 10 mg by mouth at bedtime.  10/25/15   Historical Provider, MD    Allergies Amoxicillin and Benicar [olmesartan]   REVIEW OF SYSTEMS  Negative except as noted here or in the History of  Present Illness.   PHYSICAL EXAMINATION  Initial Vital Signs Blood pressure 118/91, pulse 68, resp. rate 16, height 5\' 10"  (1.778 m), weight 270 lb (122.5 kg), SpO2 98 %.  Examination General: Well-developed, well-nourished male in no acute distress; appearance consistent with age of record HENT: normocephalic; atraumatic Eyes: pupils equal, round and reactive to light; extraocular muscles intact Neck: supple; right thyroid enlargement Heart: regular rate and rhythm; no murmurs, rubs or gallops Lungs: clear to auscultation bilaterally Abdomen: soft; nondistended; nontender; no masses or hepatosplenomegaly;  bowel sounds present Extremities: No deformity; full range of motion; pulses normal Neurologic: Awake, alert and oriented; motor function intact in all extremities and symmetric; no facial droop Skin: Warm and dry Psychiatric: Normal mood and affect   RESULTS  Summary of this visit's results, reviewed by myself:   EKG Interpretation  Date/Time:  Sunday February 03 2016 21:55:48 EDT Ventricular Rate:  84 PR Interval:  164 QRS Duration: 90 QT Interval:  358 QTC Calculation: 423 R Axis:   76 Text Interpretation:  Normal sinus rhythm Nonspecific T wave abnormality Abnormal ECG No previous ECGs available Confirmed by Patte Winkel  MD, Jonny Ruiz ([id number]) on 02/03/2016 10:45:31 PM      Laboratory Studies: Results for orders placed or performed during the hospital encounter of 02/03/16 (from the past 24 hour(s))  Basic metabolic panel     Status: Abnormal   Collection Time: 02/03/16 10:35 PM  Result Value Ref Range   Sodium 136 135 - 145 mmol/L   Potassium 3.0 (L) 3.5 - 5.1 mmol/L   Chloride 95 (L) 101 - 111 mmol/L   CO2 29 22 - 32 mmol/L   Glucose, Bld 126 (H) 65 - 99 mg/dL   BUN 11 6 - 20 mg/dL   Creatinine, Ser 6.04 0.61 - 1.24 mg/dL   Calcium 9.9 8.9 - 54.0 mg/dL   GFR calc non Af Amer >60 >60 mL/min   GFR calc Af Amer >60 >60 mL/min   Anion gap 12 5 - 15  CBC     Status: Abnormal   Collection Time: 02/03/16 10:35 PM  Result Value Ref Range   WBC 4.0 4.0 - 10.5 K/uL   RBC 5.87 (H) 4.22 - 5.81 MIL/uL   Hemoglobin 15.8 13.0 - 17.0 g/dL   HCT 98.1 19.1 - 47.8 %   MCV 77.7 (L) 78.0 - 100.0 fL   MCH 26.9 26.0 - 34.0 pg   MCHC 34.6 30.0 - 36.0 g/dL   RDW 29.5 62.1 - 30.8 %   Platelets 238 150 - 400 K/uL  Troponin I     Status: None   Collection Time: 02/03/16 10:35 PM  Result Value Ref Range   Troponin I <0.03 <0.03 ng/mL  Troponin I     Status: None   Collection Time: 02/04/16  1:35 AM  Result Value Ref Range   Troponin I <0.03 <0.03 ng/mL   Imaging Studies: Dg Chest 2  View  Result Date: 02/03/2016 CLINICAL DATA:  Chest pain. Headache and chest tightness for 2 days. EXAM: CHEST  2 VIEW COMPARISON:  Soft tissue neck radiographs 01/24/2016 FINDINGS: Leftward tracheal deviation is unchanged from recent soft tissue neck radiographs. The cardiomediastinal contours are normal. The lungs are clear. Pulmonary vasculature is normal. No consolidation, pleural effusion, or pneumothorax. No acute osseous abnormalities are seen. IMPRESSION: 1. No active cardiopulmonary disease. 2. Unchanged leftward tracheal deviation from recent soft tissue neck radiographs. This could be further evaluated with contrast-enhanced neck CT, and could be performed on  an elective basis based on patient symptomatology. Electronically Signed   By: Rubye OaksMelanie  Ehinger M.D.   On: 02/03/2016 23:03   Ct Head Wo Contrast  Result Date: 02/04/2016 CLINICAL DATA:  Sudden onset headache for 1 week, gradually worsening. EXAM: CT HEAD WITHOUT CONTRAST TECHNIQUE: Contiguous axial images were obtained from the base of the skull through the vertex without intravenous contrast. COMPARISON:  None FINDINGS: Brain: No evidence of acute infarction, hemorrhage, hydrocephalus, extra-axial collection or mass lesion/mass effect. Vascular: No hyperdense vessel or unexpected calcification. Skull: Normal. Negative for fracture or focal lesion. Sinuses/Orbits: No acute finding. IMPRESSION: Normal brain Electronically Signed   By: Ellery Plunkaniel R Mitchell M.D.   On: 02/04/2016 01:18   Ct Soft Tissue Neck W Contrast  Result Date: 02/04/2016 CLINICAL DATA:  Initial evaluation for choking sensation. EXAM: CT NECK WITH CONTRAST TECHNIQUE: Multidetector CT imaging of the neck was performed using the standard protocol following the bolus administration of intravenous contrast. CONTRAST:  100mL ISOVUE-300 IOPAMIDOL (ISOVUE-300) INJECTION 61% COMPARISON:  Prior radiograph from 01/24/2016. FINDINGS: Visualized portions of the brain are unremarkable.  Globes and orbits within normal limits. Mild scattered mucoperiosteal thickening within the ethmoidal air cells. Small retention CS noted within the left maxillary and right sphenoid sinus. Paranasal sinuses are otherwise clear. No mastoid effusion. Middle ear cavities are clear. Salivary glands including the parotid glands and submandibular glands are normal. Oral cavity within normal limits. No acute abnormality about the dentition. Palatine tonsils within normal limits. Parapharyngeal fat preserved. Nasopharynx normal. Epiglottis partially effaced by the lingual tonsils but within normal limits. No retropharyngeal fluid collection. Vallecula filled by the lingual tonsils without acute abnormality. Remainder of the hypopharynx and supraglottic larynx within normal limits. True cords symmetric and normal. Subglottic airway mildly attenuated and bowed to the left. Thyroid gland is markedly enlarged and heterogeneous in appearance, likely related to multinodular goiter. Right lobe is larger than the left, with secondary mass effect on the subglottic trachea which is bowed to the left and mildly narrowed. Substernal extension into the upper mediastinum. No adenopathy within the neck. Visualized upper mediastinum otherwise unremarkable. Visualized lungs are clear. Normal intravascular enhancement seen throughout the neck. Proximal great vessels displayed around the enlarged thyroid. No acute osseus abnormality. No worrisome lytic or blastic osseous lesions. IMPRESSION: 1. Enlarged heterogeneous thyroid with substernal extension, likely related to multinodular goiter. There is secondary mass effect on the subglottic trachea which is bowed to the left and mildly narrowed. Query this is source of patient's choking sensation. 2. No other acute abnormality within the neck. Electronically Signed   By: Rise MuBenjamin  McClintock M.D.   On: 02/04/2016 00:49    ED COURSE  Nursing notes and initial vitals signs, including pulse  oximetry, reviewed.  1:35 AM Patient's headache significantly improved after IV medications. Patient informed of CT findings showing goiter with mass effect on the trachea which may be responsible for his globus sensation. Second troponin pending.  2:20 AM Patient feeling much better. He has a follow-up appointment with a gastroenterologist today [facility] sounds like [facility] fiberoptic laryngoscopy. He was given a CD of his CT findings and was advised to discuss the goiter findings at his appointments today and tomorrow.  PROCEDURES    ED DIAGNOSES     ICD-9-CM ICD-10-CM   1. Atypical chest pain 786.59 R07.89   2. Goiter 240.9 E04.9   3. Bad headache 784.0 R51     I personally performed the services described in this  documentation, which was scribed in my presence. The recorded information has been reviewed and is accurate.    Paula Libra, MD 02/04/16 Earle Gell

## 2016-02-03 NOTE — ED Triage Notes (Addendum)
Patient reports tightness in the chest x1 hour.  Denies shortness of breath.  Denies nausea/vomiting.  States he feels light headed.  States elevated BP for the past 2 weeks causing headaches.  Patient reports pain in head at present is worse than chest.  Patient took 5mg  hydrocodone prior to arrival with no relief in pain.

## 2016-02-04 LAB — TROPONIN I: Troponin I: <0.03 ng/mL (ref <0.03)

## 2016-02-04 MED ORDER — HYDROMORPHONE HCL 1 MG/ML IJ SOLN: 1.0000 mg | Freq: Once | INTRAMUSCULAR | Status: DC

## 2016-02-04 NOTE — ED Notes (Signed)
MD at bedside to discuss results with pt. Repeat Troponin done. Pt resting comfortably.

## 2016-02-04 NOTE — ED Notes (Signed)
MD at bedside. 

## 2016-02-04 NOTE — ED Notes (Signed)
Pt given d/c instructions as per chart. Verbalizes understanding. No questions. 

## 2016-02-05 ENCOUNTER — Other Ambulatory Visit (HOSPITAL_COMMUNITY): Payer: Self-pay | Admitting: Family Medicine

## 2016-02-05 DIAGNOSIS — E049 Nontoxic goiter, unspecified: Secondary | ICD-10-CM

## 2016-12-12 ENCOUNTER — Emergency Department (HOSPITAL_BASED_OUTPATIENT_CLINIC_OR_DEPARTMENT_OTHER)
Admission: EM | Admit: 2016-12-12 | Discharge: 2016-12-12 | Disposition: A | Discharge status: Home / Self Care | Payer: BLUE CROSS/BLUE SHIELD | Attending: Emergency Medicine | Admitting: Emergency Medicine

## 2016-12-12 ENCOUNTER — Encounter (HOSPITAL_BASED_OUTPATIENT_CLINIC_OR_DEPARTMENT_OTHER): Payer: Self-pay | Admitting: *Deleted

## 2016-12-12 DIAGNOSIS — R51 Headache: Secondary | ICD-10-CM | POA: Diagnosis present

## 2016-12-12 DIAGNOSIS — Z79899 Other long term (current) drug therapy: Secondary | ICD-10-CM | POA: Insufficient documentation

## 2016-12-12 DIAGNOSIS — I10 Essential (primary) hypertension: Secondary | ICD-10-CM | POA: Diagnosis not present

## 2016-12-12 DIAGNOSIS — Z7982 Long term (current) use of aspirin: Secondary | ICD-10-CM | POA: Diagnosis not present

## 2016-12-12 DIAGNOSIS — G43009 Migraine without aura, not intractable, without status migrainosus: Secondary | ICD-10-CM | POA: Diagnosis not present

## 2016-12-12 HISTORY — DX: Migraine, unspecified, not intractable, without status migrainosus: G43.909

## 2016-12-12 MED ORDER — SODIUM CHLORIDE 0.9 % IV BOLUS (SEPSIS)
1000.0000 mL | Freq: Once | INTRAVENOUS | Status: AC
  Administered 2016-12-12: 1000 mL via INTRAVENOUS

## 2016-12-12 MED ORDER — DIPHENHYDRAMINE HCL 50 MG/ML IJ SOLN
25.0000 mg | Freq: Once | INTRAMUSCULAR | Status: AC
  Administered 2016-12-12: 25 mg via INTRAVENOUS
  Filled 2016-12-12: qty 1

## 2016-12-12 MED ORDER — KETOROLAC TROMETHAMINE 30 MG/ML IJ SOLN
30.0000 mg | Freq: Once | INTRAMUSCULAR | Status: AC
  Administered 2016-12-12: 30 mg via INTRAVENOUS
  Filled 2016-12-12: qty 1

## 2016-12-12 MED ORDER — METOCLOPRAMIDE HCL 5 MG/ML IJ SOLN
10.0000 mg | Freq: Once | INTRAMUSCULAR | Status: AC
  Administered 2016-12-12: 10 mg via INTRAVENOUS
  Filled 2016-12-12: qty 2

## 2016-12-12 NOTE — Discharge Instructions (Signed)
Return to the ED with any concerns including vomiting, changes in vision or speech, weakness of arms or legs, decreased level of alertness/lethargy, or any other alarming symptoms °

## 2016-12-12 NOTE — ED Triage Notes (Addendum)
Pt c/o h/a x 12 hrs also c/o burred vision  HX migraines

## 2016-12-12 NOTE — ED Notes (Signed)
ED Provider at bedside. 

## 2016-12-12 NOTE — ED Provider Notes (Signed)
[facility] Provider Note   CSN: [id number] Arrival date & time: 12/12/16  1821 By signing my name below, I, Levon Hedger, attest that this documentation has been prepared under the direction and in the presence of Jerelyn Scott, MD . Electronically Signed: Levon Hedger, Scribe. 12/12/2016. 8:12 PM.  History   Chief Complaint Chief Complaint  Patient presents with  . Headache   HPI Carlos Greene is a 51 y.o. male with a history of migraines who presents to the Emergency Department complaining of gradual onset, progressively worsening headache onset today. He describes his pain as constant, 10/10, throbbing pain. Pt notes associated nausea and photophobia. He has taken Excedrin Migraine just PTA with no relief of symptoms. Per pt, he typically takes hydrocodone and benadryl at home when he experiences migraines, but did not try this today. He denies any vomiting and has no other acute complaints or associated symptoms at this time.    The history is provided by the patient. No language interpreter was used.   Past Medical History:  Diagnosis Date  . Colon polyp   . Hypercholesteremia   . Hypertension   . Migraines   . Prostatitis     Patient Active Problem List   Diagnosis Date Noted  . Dehydration   . Fever in adult   . UTI (lower urinary tract infection) 10/29/2015  . Migraine headache 10/29/2015  . Fever 10/29/2015  . Leukocytosis 10/29/2015  . Hypokalemia 10/29/2015  . Conjunctivitis 10/29/2015    Past Surgical History:  Procedure Laterality Date  . CARDIAC CATHETERIZATION  1995, 2011     Home Medications    Prior to Admission medications   Medication Sig Start Date End Date Taking? Authorizing Provider  acetaminophen (TYLENOL) 325 MG tablet Take 650 mg by mouth every 6 (six) hours as needed for fever.    [provider]  amLODipine (NORVASC) 5 MG tablet Take 5 mg by mouth daily.    [provider]  aspirin EC 81 MG tablet Take 81  mg by mouth daily.    [provider]  atorvastatin (LIPITOR) 20 MG tablet Take 20 mg by mouth daily. Reported on 10/29/2015 10/25/15   [provider]  diphenhydrAMINE (BENADRYL) 25 MG tablet Take 1 tablet (25 mg total) by mouth every 6 (six) hours as needed (Use for migraine, call PCP if no improvement after 2 tablets). 10/31/15   Rolly Salter, MD  hydrochlorothiazide (HYDRODIURIL) 25 MG tablet Take 25 mg by mouth daily.    [provider]  HYDROcodone-acetaminophen (NORCO/VICODIN) 5-325 MG tablet Take 1 tablet by mouth every 6 (six) hours as needed for moderate pain or severe pain (migraine pain).  10/25/15   [provider]  LORazepam (ATIVAN) 1 MG tablet Take 1 tablet (1 mg total) by mouth 3 (three) times daily as needed for anxiety. 01/24/16   Molpus, John, MD  omeprazole (PRILOSEC) 40 MG capsule Take 40 mg by mouth 2 (two) times daily.    [provider]  SUMAtriptan (IMITREX) 100 MG tablet Take 100 mg by mouth every 2 (two) hours as needed for migraine.  10/25/15   [provider]  zolpidem (AMBIEN) 10 MG tablet Take 10 mg by mouth at bedtime.  10/25/15   [provider]    Family History Family History  Problem Relation Age of Onset  . Brain cancer Mother   . Diabetes Father   . Stroke Father     Social History Social History  Substance Use Topics  .  Smoking status: Never Smoker  . Smokeless tobacco: Never Used  . Alcohol use Yes     Comment: 1x/year    Allergies   Amoxicillin and Benicar [olmesartan]   Review of Systems Review of Systems  Eyes: Positive for photophobia.  Gastrointestinal: Positive for nausea. Negative for vomiting.  Neurological: Positive for headaches.  All other systems reviewed and are negative.  Physical Exam Updated Vital Signs BP 109/69 (BP Location: Right Arm)   Pulse (!) 55   Temp 97.7 F (36.5 C)   Resp 18   Ht 5\' 10"  (1.778 m)   Wt 126.1 kg (278 lb)   SpO2 97%   BMI 39.89 kg/m    Vitals reviewed Physical Exam  Physical Examination: General appearance - alert, well appearing, and in no distress Mental status - alert, oriented to person, place, and time Eyes - pupils equal and reactive, extraocular eye movements intact Mouth - mucous membranes moist, pharynx normal without lesions Neck - supple, no significant adenopathy Chest - clear to auscultation, no wheezes, rales or rhonchi, symmetric air entry Heart - normal rate, regular rhythm, normal S1, S2, no murmurs, rubs, clicks or gallops Neurological - alert, oriented x 3, normal speech, cranial nerves 2-12 tested and intact, strength 5/5 in extremities, sensation intact Extremities - peripheral pulses normal, no pedal edema, no clubbing or cyanosis Skin - normal coloration and turgor, no rashes  ED Treatments / Results  DIAGNOSTIC STUDIES:  Oxygen Saturation is 100% on RA, normal by my interpretation.    COORDINATION OF CARE:  8:12 PM Discussed treatment plan which includes Reglan, Toradol, benadryl and IV fluids with pt at bedside and pt agreed to plan.   Labs (all labs ordered are listed, but only abnormal results are displayed) Labs Reviewed - No data to display  EKG  EKG Interpretation None       Radiology No results found.  Procedures Procedures (including critical care time)  Medications Ordered in ED Medications  metoCLOPramide (REGLAN) injection 10 mg (10 mg Intravenous Given 12/12/16 2026)  ketorolac (TORADOL) 30 MG/ML injection 30 mg (30 mg Intravenous Given 12/12/16 2026)  diphenhydrAMINE (BENADRYL) injection 25 mg (25 mg Intravenous Given 12/12/16 2026)  sodium chloride 0.9 % bolus 1,000 mL (0 mLs Intravenous Stopped 12/12/16 2251)     Initial Impression / Assessment and Plan / ED Course  I have reviewed the triage vital signs and the nursing notes.  Pertinent labs & imaging results that were available during my care of the patient were reviewed by me and considered in my medical  decision making (see chart for details).     Pt presenting with c/o headache, similar to prior migraines.  Gradual in onset, normal neuro exam.  Doubt SAH, or other acute cause of headache.  Pt given migraine cocktail and IV fluids with improvement in headache.  Discharged with strict return precautions.  Pt agreeable with plan.  Final Clinical Impressions(s) / ED Diagnoses   Final diagnoses:  Migraine without aura and without status migrainosus, not intractable    New Prescriptions Discharge Medication List as of 12/12/2016 10:47 PM     I personally performed the services described in this documentation, which was scribed in my presence. The recorded information has been reviewed and is accurate.     Jerelyn ScottLinker, Martha, MD 12/13/16 2351

## 2017-01-30 DIAGNOSIS — Z125 Encounter for screening for malignant neoplasm of prostate: Secondary | ICD-10-CM | POA: Diagnosis not present

## 2017-01-30 DIAGNOSIS — R3915 Urgency of urination: Secondary | ICD-10-CM | POA: Diagnosis not present

## 2017-01-30 DIAGNOSIS — N411 Chronic prostatitis: Secondary | ICD-10-CM | POA: Diagnosis not present

## 2017-01-30 DIAGNOSIS — N5201 Erectile dysfunction due to arterial insufficiency: Secondary | ICD-10-CM | POA: Diagnosis not present

## 2017-02-20 DIAGNOSIS — H401121 Primary open-angle glaucoma, left eye, mild stage: Secondary | ICD-10-CM | POA: Diagnosis not present

## 2017-02-20 DIAGNOSIS — H5713 Ocular pain, bilateral: Secondary | ICD-10-CM | POA: Diagnosis not present

## 2017-02-20 DIAGNOSIS — H578 Other specified disorders of eye and adnexa: Secondary | ICD-10-CM | POA: Diagnosis not present

## 2017-02-20 DIAGNOSIS — H40021 Open angle with borderline findings, high risk, right eye: Secondary | ICD-10-CM | POA: Diagnosis not present

## 2017-03-31 DIAGNOSIS — F419 Anxiety disorder, unspecified: Secondary | ICD-10-CM | POA: Diagnosis not present

## 2017-03-31 DIAGNOSIS — I10 Essential (primary) hypertension: Secondary | ICD-10-CM | POA: Diagnosis not present

## 2017-03-31 DIAGNOSIS — Z23 Encounter for immunization: Secondary | ICD-10-CM | POA: Diagnosis not present

## 2017-03-31 DIAGNOSIS — E890 Postprocedural hypothyroidism: Secondary | ICD-10-CM | POA: Diagnosis not present

## 2017-03-31 DIAGNOSIS — E785 Hyperlipidemia, unspecified: Secondary | ICD-10-CM | POA: Diagnosis not present

## 2017-05-01 DIAGNOSIS — R7301 Impaired fasting glucose: Secondary | ICD-10-CM | POA: Diagnosis not present

## 2017-05-01 DIAGNOSIS — D582 Other hemoglobinopathies: Secondary | ICD-10-CM | POA: Diagnosis not present

## 2017-05-14 ENCOUNTER — Telehealth: Payer: Self-pay | Admitting: Hematology

## 2017-05-14 ENCOUNTER — Telehealth: Payer: Self-pay | Admitting: Genetic Counselor

## 2017-05-14 NOTE — Telephone Encounter (Signed)
05/14/17 called and left message with patient informing him of his appointment with Maylon CosKaren Powell for Genetic counseling on 07/06/17 @ 1:00.  Patient is aware to arrive 30 minutes prior to register.

## 2017-05-14 NOTE — Telephone Encounter (Signed)
I called patient and LMVM for him regarding appointment D/T/Loc/Phone#

## 2017-06-15 NOTE — Progress Notes (Signed)
HEMATOLOGY ONCOLOGY  CONSULT NOTE  Patient Care Team: Merri Brunette, MD as PCP - General (Family Medicine)  CHIEF COMPLAINTS/PURPOSE OF CONSULTATION:   Elevated hgb F  HISTORY OF PRESENTING ILLNESS:   Carlos Greene 52 y.o. male is here because of a referral from [employer] from [place] [facility] because of abnormal Hgb. He is accompanied today by his wife. He is doing well overall. Pt states that he is here today for consultation about his abnormal Hgb. He is unsure of why the hemoglobin electrophoresis was obtained, but notes that he has a family hx of sickle cell anemia and trait.   Results of his last labs (04/02/17) were Hgb levels were 14.2 and platelets were at 200k, neutrophils were borderline low-normal.  The pt had Hgb electrophoresis (04/02/17) at [facility] completed which resulted in the presence of abnormal Hgb. This lab showed that the pts Hgb did not completely transition from fetal Hgb to adult Hgb. His fetal Hgb is at 18%.   On review of symptoms, the pt reports apparent physical relief in his throat after his thyroidectomy and the ability to swallow with ease, but denies abdominal pains.   On his PMHx, pt reports that he is not a carrier for sickle cell anemia. On his FHx, his mother and father both have sickle cell trait. His brother had sickle cell anemia and passed at an early age. The pt has a PMHx of hypertension and takes amlodipine daily. 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   MEDICAL HISTORY:  Past Medical History:  Diagnosis Date  . Colon polyp   . Hypercholesteremia   . Hypertension   . Migraines   . Prostatitis     SURGICAL HISTORY: Past Surgical History:  Procedure Laterality Date  . CARDIAC CATHETERIZATION  1995, 2011    SOCIAL HISTORY: Social History   Socioeconomic  History  . Marital status: Married    Spouse name: Not on file  . Number of children: Not on file  . Years of education: Not on file  . Highest education level: Not on file  Social Needs  . Financial resource strain: Not on file  . Food insecurity - worry: Not on file  . Food insecurity - inability: Not on file  . Transportation needs - medical: Not on file  . Transportation needs - non-medical: Not on file  Occupational History  . Not on file  Tobacco Use  . Smoking status: Never Smoker  . Smokeless tobacco: Never Used  Substance and Sexual Activity  . Alcohol use: Yes    Comment: 1x/year  . Drug use: No  . Sexual activity: Yes  Other Topics Concern  . Not on file  Social History Narrative  . Not on file    FAMILY HISTORY: Family History  Problem Relation Age of Onset  . Brain cancer Mother   . Diabetes Father   . Stroke Father     ALLERGIES:  is allergic to amoxicillin and benicar [olmesartan].  MEDICATIONS:  Current Outpatient Medications  Medication Sig Dispense Refill  . acetaminophen (TYLENOL) 325 MG tablet Take 650 mg by mouth every 6 (six) hours as needed for fever.    Marland Kitchen amLODipine (NORVASC) 5 MG tablet Take 5 mg by mouth daily.    Marland Kitchen aspirin EC 81 MG tablet Take  81 mg by mouth daily.    Marland Kitchen atorvastatin (LIPITOR) 20 MG tablet Take 20 mg by mouth daily. Reported on 10/29/2015    . diphenhydrAMINE (BENADRYL) 25 MG tablet Take 1 tablet (25 mg total) by mouth every 6 (six) hours as needed (Use for migraine, call PCP if no improvement after 2 tablets). 30 tablet 0  . hydrochlorothiazide (HYDRODIURIL) 25 MG tablet Take 25 mg by mouth daily.    Marland Kitchen HYDROcodone-acetaminophen (NORCO/VICODIN) 5-325 MG tablet Take 1 tablet by mouth every 6 (six) hours as needed for moderate pain or severe pain (migraine pain).     . LORazepam (ATIVAN) 1 MG tablet Take 1 tablet (1 mg total) by mouth 3 (three) times daily as needed for anxiety. 15 tablet 0  . omeprazole (PRILOSEC) 40 MG capsule  Take 40 mg by mouth 2 (two) times daily.    . SUMAtriptan (IMITREX) 100 MG tablet Take 100 mg by mouth every 2 (two) hours as needed for migraine.     Marland Kitchen zolpidem (AMBIEN) 10 MG tablet Take 10 mg by mouth at bedtime.      No current facility-administered medications for this visit.     REVIEW OF SYSTEMS:   Constitutional: Denies fevers, chills or abnormal night sweats Eyes: Denies blurriness of vision, double vision or watery eyes Ears, nose, mouth, throat, and face: Denies mucositis or sore throat Respiratory: Denies cough, dyspnea or wheezes Cardiovascular: Denies palpitation, chest discomfort or lower extremity swelling Gastrointestinal:  Denies nausea, heartburn or change in bowel habits Skin: Denies abnormal skin rashes Lymphatics: Denies new lymphadenopathy or easy bruising Neurological:Denies numbness, tingling or new weaknesses Behavioral/Psych: Mood is stable, no new changes  All other systems were reviewed with the patient and are negative.  PHYSICAL EXAMINATION:  Vitals:   06/16/17 1034  BP: 135/81  Pulse: 65  Resp: 20  Temp: (!) 97.5 F (36.4 C)  SpO2: 100%   Filed Weights   06/16/17 1034  Weight: 296 lb 1.6 oz (134.3 kg)    GENERAL:alert, no distress and comfortable SKIN: skin color, texture, turgor are normal, no rashes or significant lesions EYES: normal, conjunctiva are pink and non-injected, sclera clear OROPHARYNX:no exudate, no erythema and lips, buccal mucosa, and tongue normal  NECK: supple, thyroid normal size, non-tender, without nodularity LYMPH:  no palpable lymphadenopathy in the cervical, axillary or inguinal LUNGS: clear to auscultation and percussion with normal breathing effort HEART: regular rate & rhythm and no murmurs and no lower extremity edema ABDOMEN:abdomen soft, non-tender and normal bowel sounds, no palpable hepatosplenomegaly. Musculoskeletal:no cyanosis of digits and no clubbing  PSYCH: alert & oriented x 3 with fluent  speech NEURO: no focal motor/sensory deficits  LABORATORY DATA:  I have reviewed the data as listed CBC 04/02/17    RADIOGRAPHIC STUDIES: I have personally reviewed the radiological images as listed and agreed with the findings in the report. No results found.  ASSESSMENT & PLAN:   52 y.o. is a male with a family hx of sickle cell trait in both parents and sickle cell anemia in his sibling along with HTN.   1. 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 HbF can also be increased from over replacement of thyroid. No evidence of MDS, no medications that could do this in the case of this patient.. Low HgA2 suggests possibility of Alpha thal trait. Plan  -Discussed with patient and his wife regarding likely etiology of elevated HbF -Informed pt about nature of adult and fetal Hgb and the consequential  and inconsequential effects of variant Hgb -On 05/01/17 the Hgb electrophoresis resulted in HgbA at 80% and HgbF at 18%  -Discussed with the patient and his wife regarding the option to rule out the presence of other pathologic Hgb, alpha thalassemia or beta thalassemia.  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   All questions were answered. The patient knows to call the clinic with any problems, questions or concerns. I spent 35 minutes counseling the patient face to face. The total time spent in the appointment was 45 minutes and more than 50% was on counseling.   This document serves as a record of services personally performed by Wyvonnia LoraGautam Anjoli Diemer, MD. It was created on his behalf by Marcelline MatesSchuyler Bain, a trained medical scribe. The creation of this  record is based on the scribe's personal observations and the provider's statements to them.   .I have reviewed the above documentation for accuracy and completeness, and I agree with the above. Johney Maine.Caroljean Monsivais Kishore Micki Cassel MD MS

## 2017-06-16 ENCOUNTER — Encounter: Payer: Self-pay | Admitting: Hematology

## 2017-06-16 ENCOUNTER — Inpatient Hospital Stay: Payer: BLUE CROSS/BLUE SHIELD | Attending: Hematology | Admitting: Hematology

## 2017-06-16 VITALS — BP 135/81 | HR 65 | Temp 97.5°F | Resp 20 | Ht 70.0 in | Wt 296.1 lb

## 2017-06-16 DIAGNOSIS — Z79899 Other long term (current) drug therapy: Secondary | ICD-10-CM | POA: Insufficient documentation

## 2017-06-16 DIAGNOSIS — Z86010 Personal history of colonic polyps: Secondary | ICD-10-CM | POA: Diagnosis not present

## 2017-06-16 DIAGNOSIS — I10 Essential (primary) hypertension: Secondary | ICD-10-CM | POA: Insufficient documentation

## 2017-06-16 DIAGNOSIS — Z8669 Personal history of other diseases of the nervous system and sense organs: Secondary | ICD-10-CM | POA: Diagnosis not present

## 2017-06-16 DIAGNOSIS — D582 Other hemoglobinopathies: Secondary | ICD-10-CM | POA: Diagnosis not present

## 2017-06-16 DIAGNOSIS — E890 Postprocedural hypothyroidism: Secondary | ICD-10-CM | POA: Diagnosis not present

## 2017-06-16 DIAGNOSIS — Z7982 Long term (current) use of aspirin: Secondary | ICD-10-CM

## 2017-06-16 DIAGNOSIS — E7800 Pure hypercholesterolemia, unspecified: Secondary | ICD-10-CM | POA: Insufficient documentation

## 2017-06-16 DIAGNOSIS — Z809 Family history of malignant neoplasm, unspecified: Secondary | ICD-10-CM | POA: Diagnosis not present

## 2017-06-16 NOTE — Patient Instructions (Signed)
Thank you for choosing [facility] to provide your oncology and hematology care.  To afford each patient quality time with our providers, please arrive 30 minutes before your scheduled appointment time.  If you arrive late for your appointment, you may be asked to reschedule.  We strive to give you quality time with our providers, and arriving late affects you and other patients whose appointments are after yours.   If you are a no show for multiple scheduled visits, you may be dismissed from the clinic at the providers discretion.    Again, thank you for choosing [facility], our hope is that these requests will decrease the amount of time that you wait before being seen by our physicians.  ______________________________________________________________________  Should you have questions after your visit to the [facility], please contact our office at [phone] between the hours of 8:30 and 4:30 p.m.    Voicemails left after 4:30p.m will not be returned until the following business day.    For prescription refill requests, please have your pharmacy contact us directly.  Please also try to allow 48 hours for prescription requests.    Please contact the scheduling department for questions regarding scheduling.  For scheduling of procedures such as PET scans, CT scans, MRI, Ultrasound, etc please contact central scheduling at [phone].    Resources For Cancer Patients and Caregivers:   Oncolink.org:  A wonderful resource for patients and healthcare providers for information regarding your disease, ways to tract your treatment, what to expect, etc.     American Cancer Society:  800-227-2345  Can help patients locate various types of support and financial assistance  Cancer Care: 1-800-813-HOPE (4673) Provides financial assistance, online support groups, medication/co-pay assistance.    [place] DSS:  336-641-3447 Where to apply for food  stamps, Medicaid, and utility assistance  Medicare Rights Center: 800-333-4114 Helps people with Medicare understand their rights and benefits, navigate the Medicare system, and secure the quality healthcare they deserve  SCAT: 336-333-6589 [place] [employer]'s shared-ride transportation service for eligible riders who have a disability that prevents them from riding the fixed route bus.    For additional information on assistance programs please contact our social worker:   Grier Hock/Abigail Elmore:  [phone]            

## 2017-07-05 IMAGING — DX DG CHEST 2V
2 series · 2 of 2 positions shown · non-contrast
Comparison: Soft tissue neck radiographs 01/24/2016

CLINICAL DATA: Chest pain. Headache and chest tightness for 2 days.

EXAM:
CHEST  2 VIEW

[chest pa]
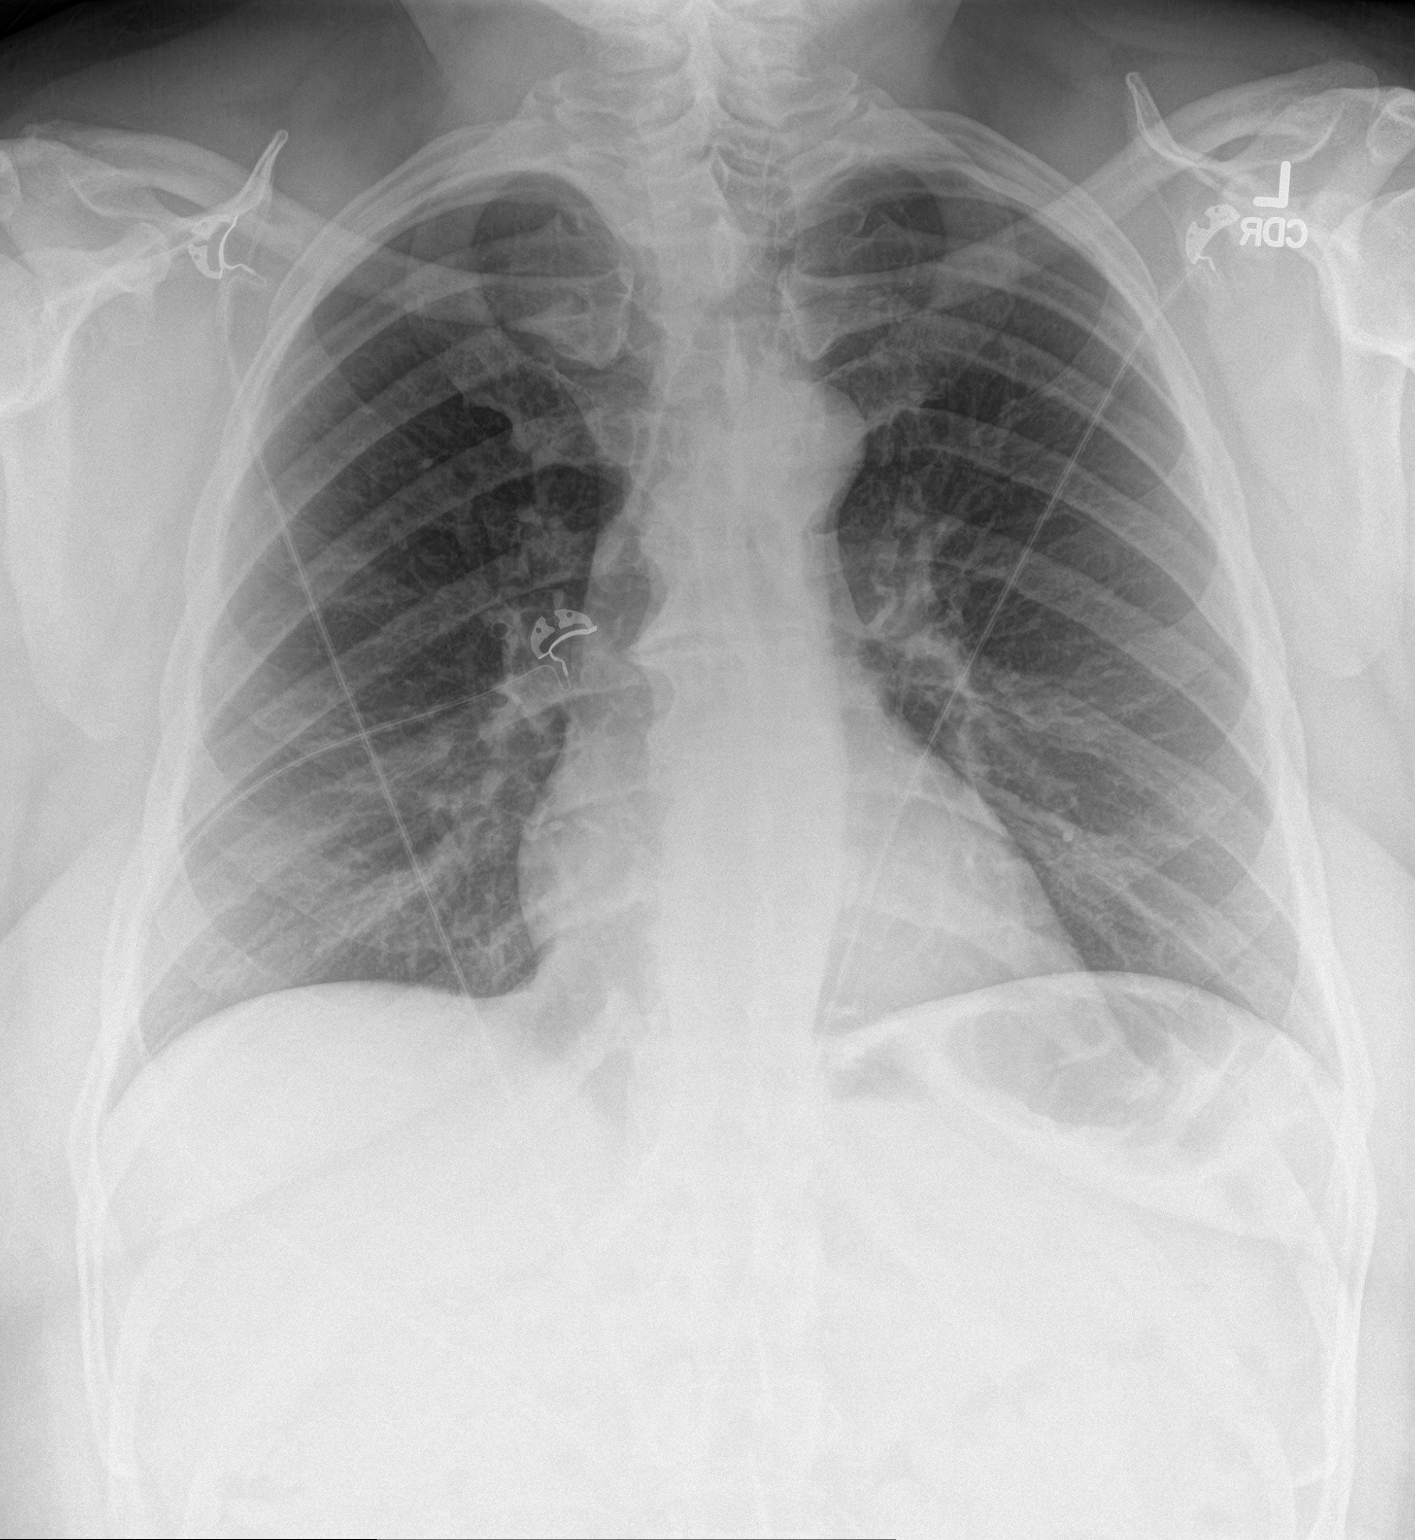

[chest lat]
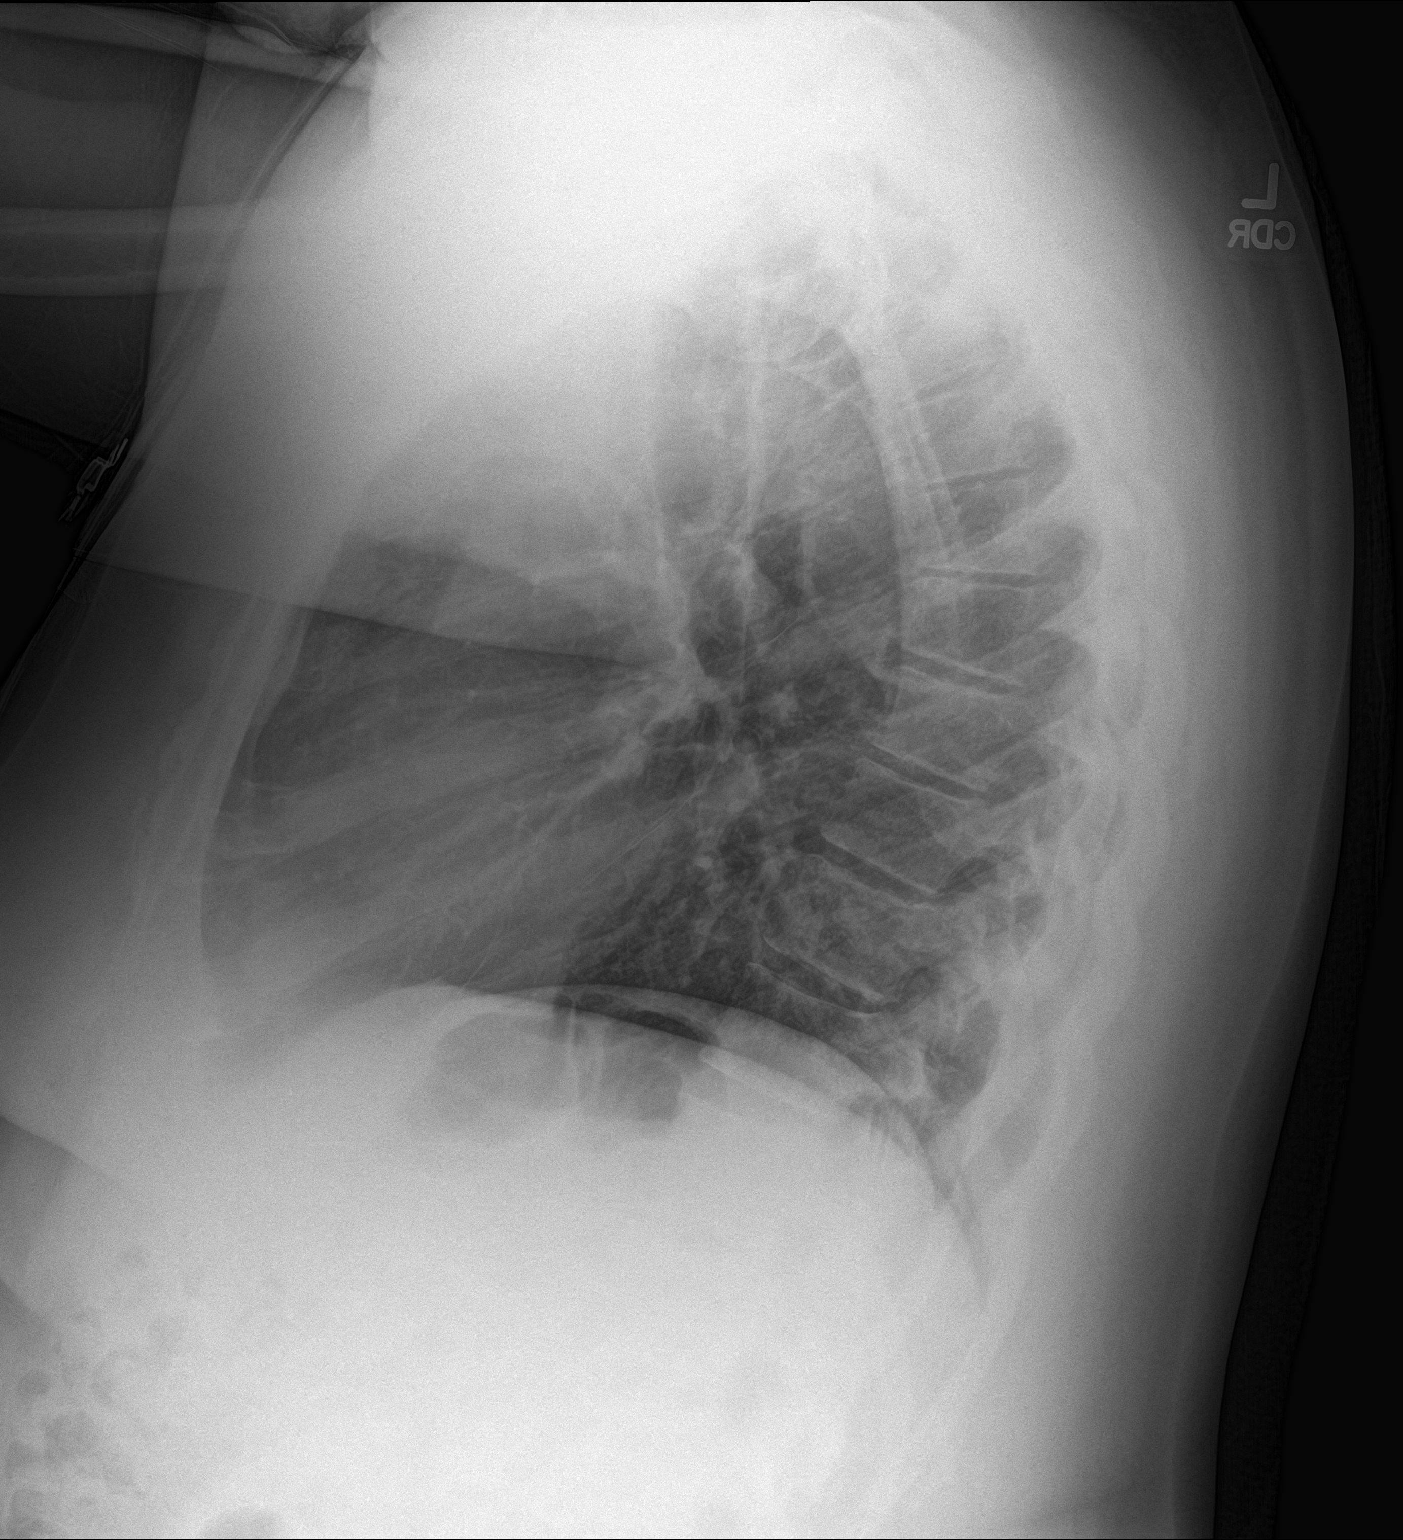

[2 of 2 positions shown; findings below may reference images not displayed]

FINDINGS: Leftward tracheal deviation is unchanged from recent soft tissue
neck radiographs. The cardiomediastinal contours are normal. The
lungs are clear. Pulmonary vasculature is normal. No consolidation,
pleural effusion, or pneumothorax. No acute osseous abnormalities
are seen.
IMPRESSION: 1. No active cardiopulmonary disease.
2. Unchanged leftward tracheal deviation from recent soft tissue
neck radiographs. This could be further evaluated with
contrast-enhanced neck CT, and could be performed on an elective
basis based on patient symptomatology.

## 2017-07-06 ENCOUNTER — Encounter: Payer: BLUE CROSS/BLUE SHIELD | Admitting: Genetic Counselor

## 2017-07-06 ENCOUNTER — Other Ambulatory Visit: Payer: BLUE CROSS/BLUE SHIELD

## 2017-08-21 DIAGNOSIS — H04123 Dry eye syndrome of bilateral lacrimal glands: Secondary | ICD-10-CM | POA: Diagnosis not present

## 2017-08-21 DIAGNOSIS — H401121 Primary open-angle glaucoma, left eye, mild stage: Secondary | ICD-10-CM | POA: Diagnosis not present

## 2017-08-21 DIAGNOSIS — H16213 Exposure keratoconjunctivitis, bilateral: Secondary | ICD-10-CM | POA: Diagnosis not present

## 2017-08-21 DIAGNOSIS — H40021 Open angle with borderline findings, high risk, right eye: Secondary | ICD-10-CM | POA: Diagnosis not present

## 2017-09-02 DIAGNOSIS — R739 Hyperglycemia, unspecified: Secondary | ICD-10-CM | POA: Diagnosis not present

## 2017-09-02 DIAGNOSIS — E890 Postprocedural hypothyroidism: Secondary | ICD-10-CM | POA: Diagnosis not present

## 2017-09-02 DIAGNOSIS — D564 Hereditary persistence of fetal hemoglobin [HPFH]: Secondary | ICD-10-CM | POA: Diagnosis not present

## 2017-09-28 DIAGNOSIS — E785 Hyperlipidemia, unspecified: Secondary | ICD-10-CM | POA: Diagnosis not present

## 2017-09-28 DIAGNOSIS — R05 Cough: Secondary | ICD-10-CM | POA: Diagnosis not present

## 2017-09-28 DIAGNOSIS — I10 Essential (primary) hypertension: Secondary | ICD-10-CM | POA: Diagnosis not present

## 2017-09-28 DIAGNOSIS — Z0000 Encounter for general adult medical examination without abnormal findings: Secondary | ICD-10-CM | POA: Diagnosis not present

## 2017-09-28 DIAGNOSIS — R739 Hyperglycemia, unspecified: Secondary | ICD-10-CM | POA: Diagnosis not present

## 2017-10-06 DIAGNOSIS — E890 Postprocedural hypothyroidism: Secondary | ICD-10-CM | POA: Diagnosis not present

## 2017-10-09 DIAGNOSIS — H401121 Primary open-angle glaucoma, left eye, mild stage: Secondary | ICD-10-CM | POA: Diagnosis not present

## 2017-10-09 DIAGNOSIS — Z7984 Long term (current) use of oral hypoglycemic drugs: Secondary | ICD-10-CM | POA: Diagnosis not present

## 2017-10-09 DIAGNOSIS — H40021 Open angle with borderline findings, high risk, right eye: Secondary | ICD-10-CM | POA: Diagnosis not present

## 2017-10-09 DIAGNOSIS — E119 Type 2 diabetes mellitus without complications: Secondary | ICD-10-CM | POA: Diagnosis not present

## 2017-11-02 DIAGNOSIS — E890 Postprocedural hypothyroidism: Secondary | ICD-10-CM | POA: Diagnosis not present

## 2017-11-02 DIAGNOSIS — R739 Hyperglycemia, unspecified: Secondary | ICD-10-CM | POA: Diagnosis not present

## 2017-12-04 DIAGNOSIS — K219 Gastro-esophageal reflux disease without esophagitis: Secondary | ICD-10-CM | POA: Diagnosis not present

## 2017-12-04 DIAGNOSIS — I10 Essential (primary) hypertension: Secondary | ICD-10-CM | POA: Diagnosis not present

## 2017-12-04 DIAGNOSIS — G4730 Sleep apnea, unspecified: Secondary | ICD-10-CM | POA: Diagnosis not present

## 2017-12-04 DIAGNOSIS — K222 Esophageal obstruction: Secondary | ICD-10-CM | POA: Diagnosis not present

## 2018-01-11 DIAGNOSIS — E890 Postprocedural hypothyroidism: Secondary | ICD-10-CM | POA: Diagnosis not present

## 2018-01-11 DIAGNOSIS — E119 Type 2 diabetes mellitus without complications: Secondary | ICD-10-CM | POA: Diagnosis not present

## 2018-01-11 DIAGNOSIS — D582 Other hemoglobinopathies: Secondary | ICD-10-CM | POA: Diagnosis not present

## 2018-01-11 DIAGNOSIS — Z8639 Personal history of other endocrine, nutritional and metabolic disease: Secondary | ICD-10-CM | POA: Diagnosis not present

## 2018-02-05 ENCOUNTER — Emergency Department (HOSPITAL_BASED_OUTPATIENT_CLINIC_OR_DEPARTMENT_OTHER): Payer: BLUE CROSS/BLUE SHIELD

## 2018-02-05 ENCOUNTER — Encounter (HOSPITAL_BASED_OUTPATIENT_CLINIC_OR_DEPARTMENT_OTHER): Payer: Self-pay

## 2018-02-05 ENCOUNTER — Other Ambulatory Visit: Payer: Self-pay

## 2018-02-05 ENCOUNTER — Emergency Department (HOSPITAL_BASED_OUTPATIENT_CLINIC_OR_DEPARTMENT_OTHER)
Admission: EM | Admit: 2018-02-05 | Discharge: 2018-02-05 | Disposition: A | Discharge status: Home / Self Care | Payer: BLUE CROSS/BLUE SHIELD | Attending: Emergency Medicine | Admitting: Emergency Medicine

## 2018-02-05 DIAGNOSIS — Z79899 Other long term (current) drug therapy: Secondary | ICD-10-CM | POA: Insufficient documentation

## 2018-02-05 DIAGNOSIS — E119 Type 2 diabetes mellitus without complications: Secondary | ICD-10-CM | POA: Insufficient documentation

## 2018-02-05 DIAGNOSIS — I10 Essential (primary) hypertension: Secondary | ICD-10-CM | POA: Insufficient documentation

## 2018-02-05 DIAGNOSIS — Z7982 Long term (current) use of aspirin: Secondary | ICD-10-CM | POA: Diagnosis not present

## 2018-02-05 DIAGNOSIS — R7989 Other specified abnormal findings of blood chemistry: Secondary | ICD-10-CM | POA: Insufficient documentation

## 2018-02-05 DIAGNOSIS — R1032 Left lower quadrant pain: Secondary | ICD-10-CM | POA: Diagnosis not present

## 2018-02-05 DIAGNOSIS — N201 Calculus of ureter: Secondary | ICD-10-CM | POA: Insufficient documentation

## 2018-02-05 HISTORY — DX: Type 2 diabetes mellitus without complications: E11.9

## 2018-02-05 LAB — URINALYSIS, ROUTINE W REFLEX MICROSCOPIC
Bilirubin Urine: NEGATIVE
Glucose, UA: 100 mg/dL — AB
Hgb urine dipstick: NEGATIVE
Ketones, ur: NEGATIVE mg/dL
Leukocytes, UA: NEGATIVE
Nitrite: NEGATIVE
Protein, ur: NEGATIVE mg/dL
Specific Gravity, Urine: 1.015 (ref 1.005–1.030)
pH: 8 (ref 5.0–8.0)

## 2018-02-05 LAB — CBC WITH DIFFERENTIAL/PLATELET
Basophils Absolute: 0 10*3/uL (ref 0.0–0.1)
Basophils Relative: 0 %
Eosinophils Absolute: 0 10*3/uL (ref 0.0–0.7)
Eosinophils Relative: 0 %
HCT: 41.2 % (ref 39.0–52.0)
Hemoglobin: 14.1 g/dL (ref 13.0–17.0)
Lymphocytes Relative: 25 %
Lymphs Abs: 1.9 10*3/uL (ref 0.7–4.0)
MCH: 27.3 pg (ref 26.0–34.0)
MCHC: 34.2 g/dL (ref 30.0–36.0)
MCV: 79.8 fL (ref 78.0–100.0)
Monocytes Absolute: 0.4 10*3/uL (ref 0.1–1.0)
Monocytes Relative: 5 %
Neutro Abs: 5.3 10*3/uL (ref 1.7–7.7)
Neutrophils Relative %: 70 %
Platelets: 254 10*3/uL (ref 150–400)
RBC: 5.16 MIL/uL (ref 4.22–5.81)
RDW: 14.4 % (ref 11.5–15.5)
WBC: 7.6 10*3/uL (ref 4.0–10.5)

## 2018-02-05 LAB — COMPREHENSIVE METABOLIC PANEL
ALT: 23 U/L (ref 0–44)
AST: 26 U/L (ref 15–41)
Albumin: 4.3 g/dL (ref 3.5–5.0)
Alkaline Phosphatase: 67 U/L (ref 38–126)
Anion gap: 15 (ref 5–15)
BUN: 10 mg/dL (ref 6–20)
CO2: 24 mmol/L (ref 22–32)
Calcium: 9.3 mg/dL (ref 8.9–10.3)
Chloride: 101 mmol/L (ref 98–111)
Creatinine, Ser: 1.33 mg/dL — ABNORMAL HIGH (ref 0.61–1.24)
GFR calc Af Amer: >60 mL/min (ref >60)
GFR calc non Af Amer: 59 mL/min — ABNORMAL LOW (ref >60)
Glucose, Bld: 169 mg/dL — ABNORMAL HIGH (ref 70–99)
Potassium: 3 mmol/L — ABNORMAL LOW (ref 3.5–5.1)
Sodium: 140 mmol/L (ref 135–145)
Total Bilirubin: 0.6 mg/dL (ref 0.3–1.2)
Total Protein: 8 g/dL (ref 6.5–8.1)

## 2018-02-05 LAB — LIPASE, BLOOD: Lipase: 22 U/L (ref 11–51)

## 2018-02-05 MED ORDER — ONDANSETRON HCL 4 MG/2ML IJ SOLN
4.0000 mg | Freq: Once | INTRAMUSCULAR | Status: AC
  Administered 2018-02-05: 4 mg via INTRAVENOUS
  Filled 2018-02-05: qty 2

## 2018-02-05 MED ORDER — FENTANYL CITRATE (PF) 100 MCG/2ML IJ SOLN
50.0000 ug | Freq: Once | INTRAMUSCULAR | Status: DC
  Filled 2018-02-05: qty 2

## 2018-02-05 MED ORDER — ONDANSETRON HCL 4 MG PO TABS: 4.0000 mg | ORAL_TABLET | Freq: Four times a day (QID) | ORAL | 0 refills | Status: DC

## 2018-02-05 MED ORDER — TAMSULOSIN HCL 0.4 MG PO CAPS: 0.4000 mg | ORAL_CAPSULE | Freq: Every day | ORAL | 0 refills | Status: DC

## 2018-02-05 MED ORDER — SODIUM CHLORIDE 0.9 % IV BOLUS
1000.0000 mL | Freq: Once | INTRAVENOUS | Status: AC
  Administered 2018-02-05: 1000 mL via INTRAVENOUS

## 2018-02-05 MED ORDER — ONDANSETRON 4 MG PO TBDP
4.0000 mg | ORAL_TABLET | Freq: Once | ORAL | Status: AC
  Administered 2018-02-05: 4 mg via ORAL

## 2018-02-05 MED ORDER — PROMETHAZINE HCL 25 MG/ML IJ SOLN
12.5000 mg | Freq: Once | INTRAMUSCULAR | Status: AC
  Administered 2018-02-05: 12.5 mg via INTRAMUSCULAR
  Filled 2018-02-05: qty 1

## 2018-02-05 MED ORDER — HYDROCODONE-ACETAMINOPHEN 5-325 MG PO TABS: 1.0000 | ORAL_TABLET | Freq: Four times a day (QID) | ORAL | 0 refills | Status: DC | PRN

## 2018-02-05 MED ORDER — DIPHENHYDRAMINE HCL 25 MG PO CAPS
25.0000 mg | ORAL_CAPSULE | Freq: Once | ORAL | Status: AC
  Administered 2018-02-05: 25 mg via ORAL
  Filled 2018-02-05: qty 1

## 2018-02-05 MED ORDER — HYDROMORPHONE HCL 1 MG/ML IJ SOLN
1.0000 mg | Freq: Once | INTRAMUSCULAR | Status: AC
  Administered 2018-02-05: 1 mg via INTRAVENOUS
  Filled 2018-02-05: qty 1

## 2018-02-05 MED ORDER — IOPAMIDOL (ISOVUE-300) INJECTION 61%
100.0000 mL | Freq: Once | INTRAVENOUS | Status: AC | PRN
  Administered 2018-02-05: 100 mL via INTRAVENOUS

## 2018-02-05 MED ORDER — ONDANSETRON 4 MG PO TBDP
ORAL_TABLET | ORAL | Status: AC
  Filled 2018-02-05: qty 1

## 2018-02-05 MED ORDER — MORPHINE SULFATE (PF) 4 MG/ML IV SOLN
4.0000 mg | Freq: Once | INTRAVENOUS | Status: AC
  Administered 2018-02-05: 4 mg via INTRAVENOUS
  Filled 2018-02-05: qty 1

## 2018-02-05 MED ORDER — POTASSIUM CHLORIDE CRYS ER 20 MEQ PO TBCR
40.0000 meq | EXTENDED_RELEASE_TABLET | Freq: Once | ORAL | Status: AC
  Administered 2018-02-05: 40 meq via ORAL
  Filled 2018-02-05: qty 2

## 2018-02-05 MED ORDER — ONDANSETRON HCL 4 MG PO TABS: 4.0000 mg | ORAL_TABLET | Freq: Three times a day (TID) | ORAL | 0 refills | Status: DC | PRN

## 2018-02-05 NOTE — Discharge Instructions (Addendum)
Please read and follow all provided instructions.  You have been diagnosed with kidney stones.    Tests performed today include: Urine test that showed blood in your urine and no infection CT scan which showed a 1 millimeter kidney on the left side Blood test that showed a mildly elevated Creatinine (kidney function) level. You will need repeat testing during your follow up appointment.  Vital signs. See below for your results today.    You are being provided a prescription for opiates (also known as narcotics) for pain control.  Opiates can be addictive and should only be used when absolutely necessary for pain control when other alternatives do not work.  We recommend you only use them for the recommended amount of time and only as prescribed.  Please do not take with other sedative medications or alcohol.  Please do not drive, operate machinery, or make important decisions while taking opiates.  Please note that these medications can be addictive and have high abuse potential.  Please keep these medications locked away from children, teenagers or any family members with history of substance abuse.  Note that your pain medication contains Acetaminophen (Tylenol), therefore it is not recommended to take additional Tylenol while on your pain medication (please read labels!). Continue to drink fluids to help you pass the stones. I recommend that you double you fluid intake for the next several days. Strain your urine and save any stones that may pass.  Use Zofran for nausea as directed. Flomax is used to help facilitate passing the stone by opening up the Ureters. Followup with your primary care doctor in regards to your hospital visit.  Return to the ED immediately if you develop fever that persists > 101, uncontrolled pain or vomiting, or other concerns.  Read the instructions below to learn more about kidney stones.  If you do not have a primary care doctor to follow-up with, use the resource guide  attached to help you find one.  What are Kidney Stones? Kidney stones (ureteral lithiasis) are solid masses that form inside your kidneys. The intense pain is caused by the stone moving through the kidney, ureter, bladder, and urethra (urinary tract). When the stone moves, the ureter starts to spasm around the stone. The stone is usually passed in the urine. This can take time to occur.  HOME CARE Drink enough fluids to keep your pee (urine) clear or pale yellow. This helps to get the stone out.  Only take medicine as told by your doctor.  Follow up with your doctor as told.  GET HELP RIGHT AWAY IF:  Your pain does not get better with medicine. Or your pain increases and gets worse over 18 hours.  You have a fever (>101).  You have new belly (abdominal) pain.  You have uncontrolled vomiting You feel faint or pass out.  Additional Information:  Your vital signs today were: BP 128/78 (BP Location: Right Arm)    Pulse (!) 54    Temp (!) 97.5 F (36.4 C) (Oral)    Resp 18    Ht 5\' 10"  (1.778 m)    Wt 125.6 kg    SpO2 100%    BMI 39.75 kg/m  If your blood pressure (BP) was elevated above 135/85 this visit, please have this repeated by your doctor within one month. ---------------

## 2018-02-05 NOTE — ED Notes (Signed)
Patient reports still feeling dizzy and nauseated.  Reports pain 3/10

## 2018-02-05 NOTE — ED Provider Notes (Addendum)
[facility] EMERGENCY DEPARTMENT Provider Note   CSN: [id number] Arrival date & time: 02/05/18  1129     History   Chief Complaint Chief Complaint  Patient presents with  . Flank Pain    HPI Carlos Tibbitts Sr. is a 52 y.o. male with a history of diabetes, hypertension, hyperlipidemia who presents emergency department today for left flank pain and left lower abdominal pain.  Patient reports he woke this morning and shortly after started developing a sharp, constant pain in his left flank that radiated into his left lower abdomen.  He denies history of similar in the past.  He reports his pain is 10/10.  He reports associated nausea without emesis.  He has not taken anything for symptoms.  He denies any back injuries, bowel/bladder incontinence, urinary retention, saddle anesthesia or lower extremity numbness/tingling/weakness.  Patient denies history of kidney stones.  He denies any urinary frequency, urinary urgency, dysuria, hematuria, foul odor of urine.  Patient reports that he was recently diagnosed with diabetes this year.  He is only on metformin.  Here he is unsure of his last A1c but reports his daily blood sugars range between 100-150.  Patient denies any upper abdominal pain, chest pain, shortness of breath.  No fever, chills, testicular pain, penile discharge, painful bowel movements, diarrhea associated with this.  He reports last bowel movement was yesterday.  He still passing gas.  No previous abdominal surgeries.  HPI  Past Medical History:  Diagnosis Date  . Colon polyp   . Diabetes mellitus without complication (HCC)   . Hypercholesteremia   . Hypertension   . Migraines   . Prostatitis     Patient Active Problem List   Diagnosis Date Noted  . Dehydration   . Fever in adult   . UTI (lower urinary tract infection) 10/29/2015  . Migraine headache 10/29/2015  . Fever 10/29/2015  . Leukocytosis 10/29/2015  . Hypokalemia 10/29/2015  . Conjunctivitis  10/29/2015    Past Surgical History:  Procedure Laterality Date  . CARDIAC CATHETERIZATION  1995, 2011  . THYROIDECTOMY          Home Medications    Prior to Admission medications   Medication Sig Start Date End Date Taking? Authorizing Provider  acetaminophen (TYLENOL) 325 MG tablet Take 650 mg by mouth every 6 (six) hours as needed for fever.    [provider]  amLODipine (NORVASC) 5 MG tablet Take 5 mg by mouth daily.    [provider]  aspirin EC 81 MG tablet Take 81 mg by mouth daily.    [provider]  atorvastatin (LIPITOR) 20 MG tablet Take 20 mg by mouth daily. Reported on 10/29/2015 10/25/15   [provider]  diphenhydrAMINE (BENADRYL) 25 MG tablet Take 1 tablet (25 mg total) by mouth every 6 (six) hours as needed (Use for migraine, call PCP if no improvement after 2 tablets). 10/31/15   Rolly Salter, MD  hydrochlorothiazide (HYDRODIURIL) 25 MG tablet Take 25 mg by mouth daily.    [provider]  HYDROcodone-acetaminophen (NORCO/VICODIN) 5-325 MG tablet Take 1 tablet by mouth every 6 (six) hours as needed for moderate pain or severe pain (migraine pain).  10/25/15   [provider]  LORazepam (ATIVAN) 1 MG tablet Take 1 tablet (1 mg total) by mouth 3 (three) times daily as needed for anxiety. 01/24/16   Molpus, John, MD  omeprazole (PRILOSEC) 40 MG capsule Take 40 mg by mouth 2 (two) times daily.  [provider]  SUMAtriptan (IMITREX) 100 MG tablet Take 100 mg by mouth every 2 (two) hours as needed for migraine.  10/25/15   [provider]  zolpidem (AMBIEN) 10 MG tablet Take 10 mg by mouth at bedtime.  10/25/15   [provider]    Family History Family History  Problem Relation Age of Onset  . Brain cancer Mother   . Diabetes Father   . Stroke Father     Social History Social History   Tobacco Use  . Smoking status: Never Smoker  . Smokeless tobacco: Never Used  Substance Use Topics    . Alcohol use: Not Currently  . Drug use: Never     Allergies   Amoxicillin and Benicar [olmesartan]   Review of Systems Review of Systems  All other systems reviewed and are negative.    Physical Exam Updated Vital Signs BP 123/83 (BP Location: Right Arm)   Pulse 82   Temp (!) 97.5 F (36.4 C) (Oral)   Resp 20   Ht 5\' 10"  (1.778 m)   Wt 125.6 kg   SpO2 100%   BMI 39.75 kg/m   Physical Exam  Constitutional: He appears well-developed and well-nourished.  Writhing in pain  HENT:  Head: Normocephalic and atraumatic.  Right Ear: External ear normal.  Left Ear: External ear normal.  Nose: Nose normal.  Mouth/Throat: Uvula is midline, oropharynx is clear and moist and mucous membranes are normal. No tonsillar exudate.  Eyes: Pupils are equal, round, and reactive to light. Right eye exhibits no discharge. Left eye exhibits no discharge. No scleral icterus.  Neck: Trachea normal. Neck supple. No spinous process tenderness present. No neck rigidity. Normal range of motion present.  Cardiovascular: Normal rate, regular rhythm and intact distal pulses.  No murmur heard. Pulses:      Radial pulses are 2+ on the right side, and 2+ on the left side.       Dorsalis pedis pulses are 2+ on the right side, and 2+ on the left side.       Posterior tibial pulses are 2+ on the right side, and 2+ on the left side.  No lower extremity swelling or edema. Calves symmetric in size bilaterally.  Pulmonary/Chest: Effort normal and breath sounds normal. He exhibits no tenderness.  Abdominal: Soft. Bowel sounds are normal. He exhibits no distension. There is tenderness in the left lower quadrant. There is no rigidity, no rebound, no guarding, no CVA tenderness, no tenderness at McBurney's point and negative Murphy's sign.  Musculoskeletal: He exhibits no edema.  Lymphadenopathy:    He has no cervical adenopathy.  Neurological: He is alert.  Skin: Skin is warm and dry. No rash noted. He is not  diaphoretic.  Psychiatric: He has a normal mood and affect.  Nursing note and vitals reviewed.    ED Treatments / Results  Labs (all labs ordered are listed, but only abnormal results are displayed) Labs Reviewed  COMPREHENSIVE METABOLIC PANEL - Abnormal; Notable for the following components:      Result Value   Potassium 3.0 (*)    Glucose, Bld 169 (*)    Creatinine, Ser 1.33 (*)    GFR calc non Af Amer 59 (*)    All other components within normal limits  URINALYSIS, ROUTINE W REFLEX MICROSCOPIC - Abnormal; Notable for the following components:   Color, Urine STRAW (*)    Glucose, UA 100 (*)    All other components within normal limits  LIPASE,  BLOOD  CBC WITH DIFFERENTIAL/PLATELET    EKG None  Radiology Ct Abdomen Pelvis W Contrast  Result Date: 02/05/2018 CLINICAL DATA:  left sided mid abdominal/flank pain since this morning. Nausea; no h/o kidney stones; no feverPt has h/o prostatitis, DM; HTN; colon polyp EXAM: CT ABDOMEN AND PELVIS WITH CONTRAST TECHNIQUE: Multidetector CT imaging of the abdomen and pelvis was performed using the standard protocol following bolus administration of intravenous contrast. CONTRAST:  ISOVUE-300 IOPAMIDOL (ISOVUE-300) INJECTION 61% COMPARISON:  None. FINDINGS: Lower chest: No acute abnormality. Hepatobiliary: Scattered subcentimeter low-attenuation lesions presumably cysts in the absence of a history of primary carcinoma. Gallbladder is nondistended. No biliary ductal dilatation. Pancreas: Unremarkable. No pancreatic ductal dilatation or surrounding inflammatory changes. Spleen: Normal in size without focal abnormality. Adrenals/Urinary Tract: Normal adrenal glands. Unremarkable right kidney. Subcentimeter low-attenuation left renal lesions probably cysts but incompletely characterized. Decreased parenchymal enhancement on the left with surrounding inflammatory/edematous changes. There is mild left pelvicaliectasis and ureterectasis extending  down to a 1 mm distal ureteral calculus just proximal to the ureteral orifice. Urinary bladder physiologically distended. Stomach/Bowel: Stomach is physiologically distended. Small bowel nondilated. Normal appendix. The colon is nondilated, unremarkable. Vascular/Lymphatic: No significant vascular findings are present. No enlarged abdominal or pelvic lymph nodes. Reproductive: Mild prostatic enlargement. Other: No ascites.  No free air. Musculoskeletal: No acute or significant osseous findings. IMPRESSION: 1. 1 mm obstructing distal left ureteral calculus. Electronically Signed   By: Corlis Leak M.D.   On: 02/05/2018 14:27    Procedures Procedures (including critical care time)  Medications Ordered in ED Medications  sodium chloride 0.9 % bolus 1,000 mL ( Intravenous Stopped 02/05/18 1340)  ondansetron (ZOFRAN) injection 4 mg (4 mg Intravenous Given 02/05/18 1235)  morphine 4 MG/ML injection 4 mg (4 mg Intravenous Given 02/05/18 1235)  HYDROmorphone (DILAUDID) injection 1 mg (1 mg Intravenous Given 02/05/18 1250)  potassium chloride SA (K-DUR,KLOR-CON) CR tablet 40 mEq (40 mEq Oral Given 02/05/18 1334)  ondansetron (ZOFRAN) injection 4 mg (4 mg Intravenous Given 02/05/18 1355)  iopamidol (ISOVUE-300) 61 % injection 100 mL (100 mLs Intravenous Contrast Given 02/05/18 1403)  morphine 4 MG/ML injection 4 mg (4 mg Intravenous Given 02/05/18 1437)     Initial Impression / Assessment and Plan / ED Course  I have reviewed the triage vital signs and the nursing notes.  Pertinent labs & imaging results that were available during my care of the patient were reviewed by me and considered in my medical decision making (see chart for details).     52 y.o. male presenting with left flank pain with radiation into left lower abdomen this morning.  Vital signs are reassuring on presentation.  He is nonseptic appearing.  Labs, UA, CT scan obtained to rule out ureterolithiasis versus diverticulitis versus obstruction  versus pyelonephritis.  Patient diagnosed with a 1 mm obstructing distal left ureteral calculus on CT scan.  No evidence of obstruction or diverticulitis.  Patient does have mildly elevated creatinine.  Will avoid NSAIDs.  He will need a recheck with his PCP in 1 week.  Potassium is placed in the department.  UA is without evidence UTI.  Patient's blood work otherwise reassuring.  His pain is controlled the department.  He is without intractable vomiting. I advised the patient to follow-up with PCP versus urology this week.  Patient reviewed in the [place] controlled substance database without any discrepancies.  Specific return precautions discussed. Time was given for all questions to be answered. The patient verbalized understanding  and agreement with plan. The patient appears safe for discharge home.  Final Clinical Impressions(s) / ED Diagnoses   Final diagnoses:  Ureterolithiasis  Elevated serum creatinine    ED Discharge Orders    None       Jacinto HalimMaczis, Michael M, PA-C 02/05/18 1559    Maczis, Elmer SowMichael M, PA-C 02/05/18 1600    Pricilla LovelessGoldston, Scott, MD 02/08/18 [id number]

## 2018-02-05 NOTE — ED Triage Notes (Signed)
C/o flank pain x this am-pt grimacing-to triage in w/c

## 2018-02-05 NOTE — ED Notes (Signed)
Patient transported to CT 

## 2018-02-12 DIAGNOSIS — H40021 Open angle with borderline findings, high risk, right eye: Secondary | ICD-10-CM | POA: Diagnosis not present

## 2018-02-12 DIAGNOSIS — E119 Type 2 diabetes mellitus without complications: Secondary | ICD-10-CM | POA: Diagnosis not present

## 2018-02-12 DIAGNOSIS — H401121 Primary open-angle glaucoma, left eye, mild stage: Secondary | ICD-10-CM | POA: Diagnosis not present

## 2018-02-12 DIAGNOSIS — Z7984 Long term (current) use of oral hypoglycemic drugs: Secondary | ICD-10-CM | POA: Diagnosis not present

## 2018-03-08 ENCOUNTER — Other Ambulatory Visit: Payer: Self-pay

## 2018-03-08 ENCOUNTER — Emergency Department (HOSPITAL_BASED_OUTPATIENT_CLINIC_OR_DEPARTMENT_OTHER): Payer: BLUE CROSS/BLUE SHIELD

## 2018-03-08 ENCOUNTER — Encounter (HOSPITAL_BASED_OUTPATIENT_CLINIC_OR_DEPARTMENT_OTHER): Payer: Self-pay

## 2018-03-08 ENCOUNTER — Emergency Department (HOSPITAL_BASED_OUTPATIENT_CLINIC_OR_DEPARTMENT_OTHER)
Admission: EM | Admit: 2018-03-08 | Discharge: 2018-03-09 | Disposition: A | Discharge status: Home / Self Care | Payer: BLUE CROSS/BLUE SHIELD | Attending: Emergency Medicine | Admitting: Emergency Medicine

## 2018-03-08 DIAGNOSIS — Z79899 Other long term (current) drug therapy: Secondary | ICD-10-CM | POA: Diagnosis not present

## 2018-03-08 DIAGNOSIS — R1110 Vomiting, unspecified: Secondary | ICD-10-CM | POA: Diagnosis not present

## 2018-03-08 DIAGNOSIS — E119 Type 2 diabetes mellitus without complications: Secondary | ICD-10-CM | POA: Insufficient documentation

## 2018-03-08 DIAGNOSIS — I10 Essential (primary) hypertension: Secondary | ICD-10-CM | POA: Diagnosis not present

## 2018-03-08 DIAGNOSIS — Z7982 Long term (current) use of aspirin: Secondary | ICD-10-CM | POA: Diagnosis not present

## 2018-03-08 DIAGNOSIS — R1084 Generalized abdominal pain: Secondary | ICD-10-CM | POA: Diagnosis not present

## 2018-03-08 DIAGNOSIS — R9431 Abnormal electrocardiogram [ECG] [EKG]: Secondary | ICD-10-CM | POA: Diagnosis not present

## 2018-03-08 DIAGNOSIS — N390 Urinary tract infection, site not specified: Secondary | ICD-10-CM | POA: Diagnosis not present

## 2018-03-08 HISTORY — DX: Calculus of kidney: N20.0

## 2018-03-08 LAB — CBC
HCT: 42.3 % (ref 39.0–52.0)
Hemoglobin: 13.9 g/dL (ref 13.0–17.0)
MCH: 27 pg (ref 26.0–34.0)
MCHC: 32.9 g/dL (ref 30.0–36.0)
MCV: 82.3 fL (ref 80.0–100.0)
Platelets: 199 10*3/uL (ref 150–400)
RBC: 5.14 MIL/uL (ref 4.22–5.81)
RDW: 15.3 % (ref 11.5–15.5)
WBC: 11.6 10*3/uL — ABNORMAL HIGH (ref 4.0–10.5)
nRBC: 0 % (ref 0.0–0.2)

## 2018-03-08 LAB — COMPREHENSIVE METABOLIC PANEL
ALT: 19 U/L (ref 0–44)
AST: 20 U/L (ref 15–41)
Albumin: 4.5 g/dL (ref 3.5–5.0)
Alkaline Phosphatase: 63 U/L (ref 38–126)
Anion gap: 12 (ref 5–15)
BUN: 15 mg/dL (ref 6–20)
CO2: 26 mmol/L (ref 22–32)
Calcium: 9.5 mg/dL (ref 8.9–10.3)
Chloride: 100 mmol/L (ref 98–111)
Creatinine, Ser: 1.2 mg/dL (ref 0.61–1.24)
GFR calc Af Amer: >60 mL/min (ref >60)
GFR calc non Af Amer: >60 mL/min (ref >60)
Glucose, Bld: 143 mg/dL — ABNORMAL HIGH (ref 70–99)
Potassium: 3.1 mmol/L — ABNORMAL LOW (ref 3.5–5.1)
Sodium: 138 mmol/L (ref 135–145)
Total Bilirubin: 1.7 mg/dL — ABNORMAL HIGH (ref 0.3–1.2)
Total Protein: 8.5 g/dL — ABNORMAL HIGH (ref 6.5–8.1)

## 2018-03-08 LAB — URINALYSIS, ROUTINE W REFLEX MICROSCOPIC
Bilirubin Urine: NEGATIVE
Glucose, UA: NEGATIVE mg/dL
Ketones, ur: 15 mg/dL — AB
Nitrite: POSITIVE — AB
Protein, ur: NEGATIVE mg/dL
Specific Gravity, Urine: 1.015 (ref 1.005–1.030)
pH: 6 (ref 5.0–8.0)

## 2018-03-08 LAB — URINALYSIS, MICROSCOPIC (REFLEX): WBC, UA: >50 WBC/hpf (ref 0–5)

## 2018-03-08 LAB — I-STAT CG4 LACTIC ACID, ED: Lactic Acid, Venous: 0.95 mmol/L (ref 0.5–1.9)

## 2018-03-08 LAB — LIPASE, BLOOD: Lipase: 25 U/L (ref 11–51)

## 2018-03-08 MED ORDER — SODIUM CHLORIDE 0.9 % IV BOLUS
1000.0000 mL | Freq: Once | INTRAVENOUS | Status: AC
  Administered 2018-03-08: 1000 mL via INTRAVENOUS

## 2018-03-08 MED ORDER — ONDANSETRON HCL 4 MG/2ML IJ SOLN
4.0000 mg | Freq: Once | INTRAMUSCULAR | Status: AC
  Administered 2018-03-08: 4 mg via INTRAVENOUS
  Filled 2018-03-08: qty 2

## 2018-03-08 MED ORDER — MORPHINE SULFATE (PF) 4 MG/ML IV SOLN: 4.0000 mg | Freq: Once | INTRAVENOUS | Status: DC

## 2018-03-08 MED ORDER — IOPAMIDOL (ISOVUE-300) INJECTION 61%
100.0000 mL | Freq: Once | INTRAVENOUS | Status: AC | PRN
  Administered 2018-03-08: 100 mL via INTRAVENOUS

## 2018-03-08 NOTE — ED Notes (Signed)
Pt returned from ct

## 2018-03-08 NOTE — ED Triage Notes (Addendum)
C/o generalized abd and lower back pain x 2 days-nausea, sweats-BS 120s-NAD-steady gait

## 2018-03-08 NOTE — ED Provider Notes (Signed)
[facility] EMERGENCY DEPARTMENT Provider Note   CSN: [id number] Arrival date & time: 03/08/18  2040     History   Chief Complaint Chief Complaint  Patient presents with  . Abdominal Pain    HPI Carlos Liszewski Sr. is a 52 y.o. male.  HPI Patient presents to the emergency department with generalized abdominal discomfort and lower back pain times the last 2 days.  The patient states he has had some sweats and nausea.  The patient states that he has not vomited.  Patient states nothing seems to make the condition better or worse.  The patient states that he did not take any medications prior to arrival for symptoms.  The patient denies chest pain, shortness of breath, headache,blurred vision, neck pain, fever, cough, weakness, numbness, dizziness, anorexia, edema, avomiting, diarrhea, rash, back pain, dysuria, hematemesis, bloody stool, near syncope, or syncope. Past Medical History:  Diagnosis Date  . Colon polyp   . Diabetes mellitus without complication (HCC)   . Hypercholesteremia   . Hypertension   . Kidney stone   . Migraines   . Prostatitis     Patient Active Problem List   Diagnosis Date Noted  . Dehydration   . Fever in adult   . UTI (lower urinary tract infection) 10/29/2015  . Migraine headache 10/29/2015  . Fever 10/29/2015  . Leukocytosis 10/29/2015  . Hypokalemia 10/29/2015  . Conjunctivitis 10/29/2015    Past Surgical History:  Procedure Laterality Date  . CARDIAC CATHETERIZATION  1995, 2011  . THYROIDECTOMY          Home Medications    Prior to Admission medications   Medication Sig Start Date End Date Taking? Authorizing Provider  acetaminophen (TYLENOL) 325 MG tablet Take 650 mg by mouth every 6 (six) hours as needed for fever.    [provider]  amLODipine (NORVASC) 5 MG tablet Take 5 mg by mouth daily.    [provider]  aspirin EC 81 MG tablet Take 81 mg by mouth daily.    [provider]    atorvastatin (LIPITOR) 20 MG tablet Take 20 mg by mouth daily. Reported on 10/29/2015 10/25/15   [provider]  diphenhydrAMINE (BENADRYL) 25 MG tablet Take 1 tablet (25 mg total) by mouth every 6 (six) hours as needed (Use for migraine, call PCP if no improvement after 2 tablets). 10/31/15   Rolly Salter, MD  hydrochlorothiazide (HYDRODIURIL) 25 MG tablet Take 25 mg by mouth daily.    [provider]  HYDROcodone-acetaminophen (NORCO) 5-325 MG tablet Take 1 tablet by mouth every 6 (six) hours as needed. 02/05/18   Maczis, Elmer Sow, PA-C  LORazepam (ATIVAN) 1 MG tablet Take 1 tablet (1 mg total) by mouth 3 (three) times daily as needed for anxiety. 01/24/16   Molpus, John, MD  omeprazole (PRILOSEC) 40 MG capsule Take 40 mg by mouth 2 (two) times daily.    [provider]  ondansetron (ZOFRAN) 4 MG tablet Take 1 tablet (4 mg total) by mouth every 8 (eight) hours as needed for nausea or vomiting. 02/05/18   Maczis, Elmer Sow, PA-C  ondansetron (ZOFRAN) 4 MG tablet Take 1 tablet (4 mg total) by mouth every 6 (six) hours. 02/05/18   Maczis, Elmer Sow, PA-C  SUMAtriptan (IMITREX) 100 MG tablet Take 100 mg by mouth every 2 (two) hours as needed for migraine.  10/25/15   [provider]  tamsulosin (FLOMAX) 0.4 MG CAPS capsule Take 1 capsule (0.4 mg total) by  mouth daily after supper. 02/05/18   Maczis, Elmer Sow, PA-C  zolpidem (AMBIEN) 10 MG tablet Take 10 mg by mouth at bedtime.  10/25/15   [provider]    Family History Family History  Problem Relation Age of Onset  . Brain cancer Mother   . Diabetes Father   . Stroke Father     Social History Social History   Tobacco Use  . Smoking status: Never Smoker  . Smokeless tobacco: Never Used  Substance Use Topics  . Alcohol use: Not Currently  . Drug use: Never     Allergies   Amoxicillin and Benicar [olmesartan]   Review of Systems Review of Systems All other systems negative except as  documented in the HPI. All pertinent positives and negatives as reviewed in the HPI.  Physical Exam Updated Vital Signs BP (!) 156/90 (BP Location: Left Arm)   Pulse (!) 110   Temp 99.8 F (37.7 C) (Rectal)   Resp 20   Ht 5\' 10"  (1.778 m)   Wt 128.4 kg   SpO2 97%   BMI 40.61 kg/m   Physical Exam  Constitutional: He is oriented to person, place, and time. He appears well-developed and well-nourished. No distress.  HENT:  Head: Normocephalic and atraumatic.  Mouth/Throat: Oropharynx is clear and moist.  Eyes: Pupils are equal, round, and reactive to light.  Neck: Normal range of motion. Neck supple.  Cardiovascular: Normal rate, regular rhythm and normal heart sounds. Exam reveals no gallop and no friction rub.  No murmur heard. Pulmonary/Chest: Effort normal and breath sounds normal. No respiratory distress. He has no wheezes.  Abdominal: Soft. Bowel sounds are normal. He exhibits no distension. There is generalized tenderness. There is no rigidity, no rebound and no guarding.  Neurological: He is alert and oriented to person, place, and time. He exhibits normal muscle tone. Coordination normal.  Skin: Skin is warm and dry. Capillary refill takes less than 2 seconds. No rash noted. No erythema.  Psychiatric: He has a normal mood and affect. His behavior is normal.  Nursing note and vitals reviewed.    ED Treatments / Results  Labs (all labs ordered are listed, but only abnormal results are displayed) Labs Reviewed  COMPREHENSIVE METABOLIC PANEL - Abnormal; Notable for the following components:      Result Value   Potassium 3.1 (*)    Glucose, Bld 143 (*)    Total Protein 8.5 (*)    Total Bilirubin 1.7 (*)    All other components within normal limits  CBC - Abnormal; Notable for the following components:   WBC 11.6 (*)    All other components within normal limits  URINALYSIS, ROUTINE W REFLEX MICROSCOPIC - Abnormal; Notable for the following components:   APPearance  CLOUDY (*)    Hgb urine dipstick TRACE (*)    Ketones, ur 15 (*)    Nitrite POSITIVE (*)    Leukocytes, UA SMALL (*)    All other components within normal limits  URINALYSIS, MICROSCOPIC (REFLEX) - Abnormal; Notable for the following components:   Bacteria, UA MANY (*)    All other components within normal limits  LIPASE, BLOOD  I-STAT CG4 LACTIC ACID, ED  I-STAT CG4 LACTIC ACID, ED    EKG None  Radiology Ct Abdomen Pelvis W Contrast  Result Date: 03/08/2018 CLINICAL DATA:  Generalized abdominal pain for 2 days. Nausea, vomiting, and sweats. EXAM: CT ABDOMEN AND PELVIS WITH CONTRAST TECHNIQUE: Multidetector CT imaging of the abdomen and pelvis was  performed using the standard protocol following bolus administration of intravenous contrast. CONTRAST:  ISOVUE-300 IOPAMIDOL (ISOVUE-300) INJECTION 61% COMPARISON:  02/05/2018 FINDINGS: Lower chest: Lung bases are clear. Hepatobiliary: Scattered subcentimeter low-attenuation lesions in the liver are too small for characterization but probably represent small cysts or hemangiomas. Gallbladder and bile ducts are unremarkable. Pancreas: Unremarkable. No pancreatic ductal dilatation or surrounding inflammatory changes. Spleen: Normal in size without focal abnormality. Adrenals/Urinary Tract: Adrenal glands are unremarkable. Kidneys are normal, without renal calculi, focal lesion, or hydronephrosis. Bladder is decompressed, but the wall appears mildly thickened. This may indicate cystitis. Stomach/Bowel: Stomach is within normal limits. Appendix appears normal. No evidence of bowel wall thickening, distention, or inflammatory changes. Vascular/Lymphatic: No significant vascular findings are present. No enlarged abdominal or pelvic lymph nodes. Reproductive: Diffuse prostate enlargement. Diameter measures 6.3 cm. Other: No free air or free fluid in the abdomen. Minimal umbilical hernia containing fat. Musculoskeletal: No acute or significant osseous  findings. IMPRESSION: 1. No evidence of bowel obstruction or inflammation. Normal appendix. 2. Bladder wall thickening may indicate cystitis. 3. Diffuse prostate enlargement. 4. Scattered subcentimeter low-attenuation lesions in the liver are too small for characterization but probably represent cysts or hemangiomas. Aortic Atherosclerosis (ICD10-I70.0). Electronically Signed   By: Burman Nieves M.D.   On: 03/08/2018 23:42    Procedures Procedures (including critical care time)  Medications Ordered in ED Medications  morphine 4 MG/ML injection 4 mg (has no administration in time range)  sodium chloride 0.9 % bolus 1,000 mL (1,000 mLs Intravenous New Bag/Given 03/08/18 2231)  ondansetron (ZOFRAN) injection 4 mg (4 mg Intravenous Given 03/08/18 2231)  iopamidol (ISOVUE-300) 61 % injection 100 mL (100 mLs Intravenous Contrast Given 03/08/18 2317)     Initial Impression / Assessment and Plan / ED Course  I have reviewed the triage vital signs and the nursing notes.  Pertinent labs & imaging results that were available during my care of the patient were reviewed by me and considered in my medical decision making (see chart for details).     Is feeling better following IV fluids.  The patient is advised to return for any worsening in his condition.  Patient agrees to this plan and all questions were answered.  I do feel that the patient urinary tract infection is most likely leading to most of his symptoms.  Patient has a urologist that he has seen in the past.  Advised him to follow-up with them may be a  good idea. Final Clinical Impressions(s) / ED Diagnoses   Final diagnoses:  None    ED Discharge Orders    None       Charlestine Night, PA-C 03/08/18 2358    Gwyneth Sprout, MD 03/09/18 0000

## 2018-03-09 MED ORDER — CIPROFLOXACIN HCL 500 MG PO TABS: 500.0000 mg | ORAL_TABLET | Freq: Two times a day (BID) | ORAL | 0 refills | Status: DC

## 2018-03-09 MED ORDER — PROMETHAZINE HCL 25 MG PO TABS: 25.0000 mg | ORAL_TABLET | Freq: Four times a day (QID) | ORAL | 0 refills | Status: DC | PRN

## 2018-03-09 NOTE — Discharge Instructions (Addendum)
Tylenol Motrin for fever.  Return here as needed for any worsening in your condition..  Follow-up with your doctor for recheck.

## 2018-05-17 DIAGNOSIS — E538 Deficiency of other specified B group vitamins: Secondary | ICD-10-CM | POA: Diagnosis not present

## 2018-05-17 DIAGNOSIS — E890 Postprocedural hypothyroidism: Secondary | ICD-10-CM | POA: Diagnosis not present

## 2018-05-17 DIAGNOSIS — Z79899 Other long term (current) drug therapy: Secondary | ICD-10-CM | POA: Diagnosis not present

## 2018-05-17 DIAGNOSIS — Z8639 Personal history of other endocrine, nutritional and metabolic disease: Secondary | ICD-10-CM | POA: Diagnosis not present

## 2018-05-17 DIAGNOSIS — E119 Type 2 diabetes mellitus without complications: Secondary | ICD-10-CM | POA: Diagnosis not present

## 2018-05-20 DIAGNOSIS — G4733 Obstructive sleep apnea (adult) (pediatric): Secondary | ICD-10-CM | POA: Diagnosis not present

## 2018-05-26 DIAGNOSIS — J189 Pneumonia, unspecified organism: Secondary | ICD-10-CM

## 2018-05-26 HISTORY — DX: Pneumonia, unspecified organism: J18.9

## 2018-06-04 DIAGNOSIS — E538 Deficiency of other specified B group vitamins: Secondary | ICD-10-CM | POA: Diagnosis not present

## 2018-06-11 DIAGNOSIS — E538 Deficiency of other specified B group vitamins: Secondary | ICD-10-CM | POA: Diagnosis not present

## 2018-06-15 DIAGNOSIS — H04123 Dry eye syndrome of bilateral lacrimal glands: Secondary | ICD-10-CM | POA: Diagnosis not present

## 2018-06-18 DIAGNOSIS — E538 Deficiency of other specified B group vitamins: Secondary | ICD-10-CM | POA: Diagnosis not present

## 2018-06-28 DIAGNOSIS — E538 Deficiency of other specified B group vitamins: Secondary | ICD-10-CM | POA: Diagnosis not present

## 2018-07-07 DIAGNOSIS — I10 Essential (primary) hypertension: Secondary | ICD-10-CM | POA: Diagnosis not present

## 2018-07-07 DIAGNOSIS — G43909 Migraine, unspecified, not intractable, without status migrainosus: Secondary | ICD-10-CM | POA: Diagnosis not present

## 2018-07-07 DIAGNOSIS — B349 Viral infection, unspecified: Secondary | ICD-10-CM | POA: Diagnosis not present

## 2018-07-07 DIAGNOSIS — J029 Acute pharyngitis, unspecified: Secondary | ICD-10-CM | POA: Diagnosis not present

## 2018-07-21 DIAGNOSIS — N390 Urinary tract infection, site not specified: Secondary | ICD-10-CM | POA: Diagnosis not present

## 2018-07-21 DIAGNOSIS — R310 Gross hematuria: Secondary | ICD-10-CM | POA: Diagnosis not present

## 2018-07-21 DIAGNOSIS — B9620 Unspecified Escherichia coli [E. coli] as the cause of diseases classified elsewhere: Secondary | ICD-10-CM | POA: Diagnosis not present

## 2018-07-22 ENCOUNTER — Emergency Department (HOSPITAL_BASED_OUTPATIENT_CLINIC_OR_DEPARTMENT_OTHER)
Admission: EM | Admit: 2018-07-22 | Discharge: 2018-07-22 | Disposition: A | Discharge status: Home / Self Care | Payer: BLUE CROSS/BLUE SHIELD | Attending: Emergency Medicine | Admitting: Emergency Medicine

## 2018-07-22 ENCOUNTER — Other Ambulatory Visit: Payer: Self-pay

## 2018-07-22 ENCOUNTER — Encounter (HOSPITAL_BASED_OUTPATIENT_CLINIC_OR_DEPARTMENT_OTHER): Payer: Self-pay | Admitting: Emergency Medicine

## 2018-07-22 ENCOUNTER — Emergency Department (HOSPITAL_BASED_OUTPATIENT_CLINIC_OR_DEPARTMENT_OTHER): Payer: BLUE CROSS/BLUE SHIELD

## 2018-07-22 DIAGNOSIS — R112 Nausea with vomiting, unspecified: Secondary | ICD-10-CM | POA: Insufficient documentation

## 2018-07-22 DIAGNOSIS — R197 Diarrhea, unspecified: Secondary | ICD-10-CM | POA: Diagnosis not present

## 2018-07-22 DIAGNOSIS — Z79899 Other long term (current) drug therapy: Secondary | ICD-10-CM | POA: Insufficient documentation

## 2018-07-22 DIAGNOSIS — E119 Type 2 diabetes mellitus without complications: Secondary | ICD-10-CM | POA: Insufficient documentation

## 2018-07-22 DIAGNOSIS — I10 Essential (primary) hypertension: Secondary | ICD-10-CM | POA: Insufficient documentation

## 2018-07-22 DIAGNOSIS — R1084 Generalized abdominal pain: Secondary | ICD-10-CM | POA: Diagnosis not present

## 2018-07-22 DIAGNOSIS — R05 Cough: Secondary | ICD-10-CM | POA: Diagnosis not present

## 2018-07-22 DIAGNOSIS — Z7982 Long term (current) use of aspirin: Secondary | ICD-10-CM | POA: Insufficient documentation

## 2018-07-22 DIAGNOSIS — R1110 Vomiting, unspecified: Secondary | ICD-10-CM | POA: Diagnosis not present

## 2018-07-22 LAB — URINALYSIS, ROUTINE W REFLEX MICROSCOPIC
Bilirubin Urine: NEGATIVE
Glucose, UA: NEGATIVE mg/dL
Ketones, ur: NEGATIVE mg/dL
Leukocytes,Ua: NEGATIVE
Nitrite: NEGATIVE
Protein, ur: NEGATIVE mg/dL
Specific Gravity, Urine: 1.01 (ref 1.005–1.030)
pH: 7.5 (ref 5.0–8.0)

## 2018-07-22 LAB — URINALYSIS, MICROSCOPIC (REFLEX)

## 2018-07-22 LAB — COMPREHENSIVE METABOLIC PANEL
ALT: 21 U/L (ref 0–44)
AST: 20 U/L (ref 15–41)
Albumin: 4.3 g/dL (ref 3.5–5.0)
Alkaline Phosphatase: 59 U/L (ref 38–126)
Anion gap: 7 (ref 5–15)
BUN: 13 mg/dL (ref 6–20)
CO2: 27 mmol/L (ref 22–32)
Calcium: 9.1 mg/dL (ref 8.9–10.3)
Chloride: 103 mmol/L (ref 98–111)
Creatinine, Ser: 1.17 mg/dL (ref 0.61–1.24)
GFR calc Af Amer: >60 mL/min (ref >60)
GFR calc non Af Amer: >60 mL/min (ref >60)
Glucose, Bld: 138 mg/dL — ABNORMAL HIGH (ref 70–99)
Potassium: 3.1 mmol/L — ABNORMAL LOW (ref 3.5–5.1)
Sodium: 137 mmol/L (ref 135–145)
Total Bilirubin: 1.2 mg/dL (ref 0.3–1.2)
Total Protein: 7.4 g/dL (ref 6.5–8.1)

## 2018-07-22 LAB — CBC WITH DIFFERENTIAL/PLATELET
Abs Immature Granulocytes: 0.02 10*3/uL (ref 0.00–0.07)
Basophils Absolute: 0 10*3/uL (ref 0.0–0.1)
Basophils Relative: 0 %
Eosinophils Absolute: 0 10*3/uL (ref 0.0–0.5)
Eosinophils Relative: 0 %
HCT: 44.3 % (ref 39.0–52.0)
Hemoglobin: 14.2 g/dL (ref 13.0–17.0)
Immature Granulocytes: 0 %
Lymphocytes Relative: 6 %
Lymphs Abs: 0.5 10*3/uL — ABNORMAL LOW (ref 0.7–4.0)
MCH: 26.4 pg (ref 26.0–34.0)
MCHC: 32.1 g/dL (ref 30.0–36.0)
MCV: 82.5 fL (ref 80.0–100.0)
Monocytes Absolute: 0.3 10*3/uL (ref 0.1–1.0)
Monocytes Relative: 4 %
Neutro Abs: 6.8 10*3/uL (ref 1.7–7.7)
Neutrophils Relative %: 90 %
Platelets: 215 10*3/uL (ref 150–400)
RBC: 5.37 MIL/uL (ref 4.22–5.81)
RDW: 14.3 % (ref 11.5–15.5)
WBC: 7.6 10*3/uL (ref 4.0–10.5)
nRBC: 0 % (ref 0.0–0.2)

## 2018-07-22 LAB — LACTIC ACID, PLASMA
Lactic Acid, Venous: 2.1 mmol/L (ref 0.5–1.9)
Lactic Acid, Venous: 1.8 mmol/L (ref 0.5–1.9)

## 2018-07-22 MED ORDER — IOPAMIDOL (ISOVUE-300) INJECTION 61%
30.0000 mL | Freq: Once | INTRAVENOUS | Status: AC | PRN
  Administered 2018-07-22: 15 mL via ORAL

## 2018-07-22 MED ORDER — MORPHINE SULFATE (PF) 4 MG/ML IV SOLN
4.0000 mg | Freq: Once | INTRAVENOUS | Status: AC
  Administered 2018-07-22: 4 mg via INTRAVENOUS
  Filled 2018-07-22: qty 1

## 2018-07-22 MED ORDER — IOHEXOL 300 MG/ML  SOLN
100.0000 mL | Freq: Once | INTRAMUSCULAR | Status: AC | PRN
  Administered 2018-07-22: 100 mL via INTRAVENOUS

## 2018-07-22 MED ORDER — SODIUM CHLORIDE 0.9% FLUSH
3.0000 mL | Freq: Once | INTRAVENOUS | Status: DC
  Filled 2018-07-22: qty 3

## 2018-07-22 MED ORDER — SODIUM CHLORIDE 0.9 % IV BOLUS
1000.0000 mL | Freq: Once | INTRAVENOUS | Status: AC
  Administered 2018-07-22: 1000 mL via INTRAVENOUS

## 2018-07-22 MED ORDER — ACETAMINOPHEN 325 MG PO TABS
650.0000 mg | ORAL_TABLET | Freq: Once | ORAL | Status: AC
  Administered 2018-07-22: 650 mg via ORAL
  Filled 2018-07-22: qty 2

## 2018-07-22 MED ORDER — IBUPROFEN 800 MG PO TABS
800.0000 mg | ORAL_TABLET | Freq: Once | ORAL | Status: AC
  Administered 2018-07-22: 800 mg via ORAL
  Filled 2018-07-22: qty 1

## 2018-07-22 MED ORDER — ONDANSETRON HCL 4 MG/2ML IJ SOLN
4.0000 mg | Freq: Once | INTRAMUSCULAR | Status: AC
  Administered 2018-07-22: 4 mg via INTRAVENOUS
  Filled 2018-07-22: qty 2

## 2018-07-22 NOTE — Discharge Instructions (Addendum)
You have been seen today for abdominal pain. Please read and follow all provided instructions.   1. Medications: take antibiotics as prescribed by urologist, usual home medications 2. Treatment: rest, drink plenty of fluids 3. Follow Up: Please follow up with your primary doctor in 2 days for discussion of your diagnoses and further evaluation after today's visit; if you do not have a primary care doctor use the resource guide provided to find one; Please return to the ER for any new or worsening symptoms. Please obtain all of your results from medical records or have your doctors office obtain the results - share them with your doctor - you should be seen at your doctors office. Call today to arrange your follow up.   Take medications as prescribed. Please review all of the medicines and only take them if you do not have an allergy to them. Return to the emergency room for worsening condition or new concerning symptoms. Follow up with your regular doctor. If you don't have a regular doctor use one of the numbers below to establish a primary care doctor.  Please be aware that if you are taking birth control pills, taking other prescriptions, ESPECIALLY ANTIBIOTICS may make the birth control ineffective - if this is the case, either do not engage in sexual activity or use alternative methods of birth control such as condoms until you have finished the medicine and your family doctor says it is OK to restart them. If you are on a blood thinner such as COUMADIN, be aware that any other medicine that you take may cause the coumadin to either work too much, or not enough - you should have your coumadin level rechecked in next 7 days if this is the case.  ?  It is also a possibility that you have an allergic reaction to any of the medicines that you have been prescribed - Everybody reacts differently to medications and while MOST people have no trouble with most medicines, you may have a reaction such as nausea,  vomiting, rash, swelling, shortness of breath. If this is the case, please stop taking the medicine immediately and contact your physician.  ?  You should return to the ER if you develop severe or worsening symptoms.   Emergency Department Resource Guide 1) Find a Doctor and Pay Out of Pocket Although you won't have to find out who is covered by your insurance plan, it is a good idea to ask around and get recommendations. You will then need to call the office and see if the doctor you have chosen will accept you as a new patient and what types of options they offer for patients who are self-pay. Some doctors offer discounts or will set up payment plans for their patients who do not have insurance, but you will need to ask so you aren't surprised when you get to your appointment.  2) Contact Your Local Health Department Not all health departments have doctors that can see patients for sick visits, but many do, so it is worth a call to see if yours does. If you don't know where your local health department is, you can check in your phone book. The CDC also has a tool to help you locate your state's health department, and many state websites also have listings of all of their local health departments.  3) Find a Walk-in Clinic If your illness is not likely to be very severe or complicated, you may want to try a walk in clinic.  These are popping up all over the country in pharmacies, drugstores, and shopping centers. They're usually staffed by nurse practitioners or physician assistants that have been trained to treat common illnesses and complaints. They're usually fairly quick and inexpensive. However, if you have serious medical issues or chronic medical problems, these are probably not your best option.  No Primary Care Doctor: Call Health Connect at  [phone] - they can help you locate a primary care doctor that  accepts your insurance, provides certain services, etc. Physician Referral [phone]  Emergency Department Resource Guide 1) Find a Doctor and Pay Out of Pocket Although you won't have to find out who is covered by your insurance plan, it is a good idea to ask around and get recommendations. You will then need to call the office and see if the doctor you have chosen will accept you as a new patient and what types of options they offer for patients who are self-pay. Some doctors offer discounts or will set up payment plans for their patients who do not have insurance, but you will need to ask so you aren't surprised when you get to your appointment.  2) Contact Your Local Health Department Not all health departments have doctors that can see patients for sick visits, but many do, so it is worth a call to see if yours does. If you don't know where your local health department is, you can check in your phone book. The CDC also has a tool to help you locate your state's health department, and many state websites also have listings of all of their local health departments.  3) Find a [facility] If your illness is not likely to be very severe or complicated, you may want to try a walk in clinic. These are popping up all over the country in pharmacies, drugstores, and shopping centers. They're usually staffed by nurse practitioners or physician assistants that have been trained to treat common illnesses and complaints. They're usually fairly quick and inexpensive. However, if you have serious medical issues or chronic medical problems, these are probably not your best option.  No Primary Care Doctor: Call Health Connect at  [phone] - they can help you locate a primary care doctor that  accepts your insurance, provides certain services, etc. Physician Referral Service- [phone]  Chronic Pain Problems: Organization         Address  Phone   Notes  [facility]  [phone] Patients need to be referred by their primary care doctor.    Medication Assistance: Organization         Address  Phone   Notes  [facility] Medication [facility] [address] [place], [place] [address] [phone] --Must be a resident of [facility] -- Must have NO insurance coverage whatsoever (no Medicaid/ Medicare, etc.) -- The pt. MUST have a primary care doctor that directs their care regularly and follows them in the community   [facility]  [phone]   [employer]  [phone]    Agencies that provide inexpensive medical care: Organization         Address  Phone   Notes  [place]  [phone]   [facility]    [phone]   [facility] [place], [place] [address] [phone]   [place] [address], [place] [phone]   [facility]    (  [phone]   [facility]    [phone]   [facility]  [address], [place] Phone:  [phone], Fax:  [phone] Hours of Operation:  9 am - 6 pm, M-F.  Also accepts Medicaid/Medicare and self-pay.  [facility] for [place] [address], [address], [place] Phone: [phone], Fax: [phone]. Hours of Operation:  8:30 am - 5:30 pm, M-F.  Also accepts Medicaid and self-pay.  [facility] [address], [place] Phone: [phone]   [place], [place], [place] [phone], Ext. [phone] Mondays & Thursdays: 7-9 AM.  First 15 patients are seen on a first come, first serve basis.    [place] Providers:  Organization         Address  Phone   Notes  [facility] [address], [place] [phone] Also accepts self-pay patients.  [facility] [address], [place]  [phone]   [place], [address], [place] [phone]   [facility] [address] [place] [phone]   Lucianne Lei [address], [place]   [phone] Only accepts [place] [facility] [place] patients after they have their name applied to their card.   Self-Pay (no insurance) in [facility]:  Organization         Address  Phone   Notes  Sickle Cell Patients, [facility] [place] [phone]   [facility] [place] [phone]   [facility] [place]  [place], [place], [place] [phone]   [facility]/Dr. Osei-Bonsu  [address], [place] or [address], [place] [phone] Phone number for both [place] and [place] locations is the same.  [facility] [address], [place] [phone]   [facility] [address] [place] or [address] [phone]   [facility] [address] [place] [phone], phone; [phone], fax Sees patients 1st and 3rd Saturday of every month.  Must not qualify for public or private insurance (i.e. Medicaid, Medicare, [place] Health Choice, Veterans' Benefits)  Household income should be no more than 200% of the poverty level The clinic cannot treat you if you are pregnant or think you are pregnant  Sexually transmitted diseases are not treated at the clinic.

## 2018-07-22 NOTE — ED Provider Notes (Addendum)
[facility] EMERGENCY DEPARTMENT Provider Note   CSN: [id number] Arrival date & time: 07/22/18  1045    History   Chief Complaint Chief Complaint  Patient presents with  . Abdominal Pain    HPI Carlos Greene Sr. is a 53 y.o. male with a PMH of Diabetes, HLD, HTN, Prostatitis, Migraines, and Kidney stones presenting with generalized intermittent abdominal pain onset this morning. Patient reports he has had 5 episodes of vomiting and multiple episodes of diarrhea. Patient reports dysuria and hematuria. Patient describes abdominal pain as sharp and states nothing makes it better or worse. Patient reports he has also had a productive cough, congestion, and rhinorrhea for 3 weeks. Patient reports fever and sick exposures at home. Patient denies taking any medications. Patient states he was evaluated by urology yesterday and diagnosed with possible prostatitis. Patient reports he was prescribed antibiotics, but has not started them yet. Patient reports an intermittent frontal headache and states it is similar to previous headaches. Patient denies neck pain, syncope, weakness, dizziness, or numbness. Patient denies chest pain or shortness of breath. Patient states he last ate yesterday at 3pm. Patient denies previous abdominal surgeries.      HPI  Past Medical History:  Diagnosis Date  . Colon polyp   . Diabetes mellitus without complication (HCC)   . Hypercholesteremia   . Hypertension   . Kidney stone   . Migraines   . Prostatitis     Patient Active Problem List   Diagnosis Date Noted  . Dehydration   . Fever in adult   . UTI (lower urinary tract infection) 10/29/2015  . Migraine headache 10/29/2015  . Fever 10/29/2015  . Leukocytosis 10/29/2015  . Hypokalemia 10/29/2015  . Conjunctivitis 10/29/2015    Past Surgical History:  Procedure Laterality Date  . CARDIAC CATHETERIZATION  1995, 2011  . THYROIDECTOMY         Home Medications    Prior to Admission  medications   Medication Sig Start Date End Date Taking? Authorizing Provider  acetaminophen (TYLENOL) 325 MG tablet Take 650 mg by mouth every 6 (six) hours as needed for fever.    [provider]  amLODipine (NORVASC) 5 MG tablet Take 5 mg by mouth daily.    [provider]  aspirin EC 81 MG tablet Take 81 mg by mouth daily.    [provider]  atorvastatin (LIPITOR) 20 MG tablet Take 20 mg by mouth daily. Reported on 10/29/2015 10/25/15   [provider]  ciprofloxacin (CIPRO) 500 MG tablet Take 1 tablet (500 mg total) by mouth 2 (two) times daily. 03/09/18   Lawyer, Cristal Deer, PA-C  diphenhydrAMINE (BENADRYL) 25 MG tablet Take 1 tablet (25 mg total) by mouth every 6 (six) hours as needed (Use for migraine, call PCP if no improvement after 2 tablets). 10/31/15   Rolly Salter, MD  hydrochlorothiazide (HYDRODIURIL) 25 MG tablet Take 25 mg by mouth daily.    [provider]  HYDROcodone-acetaminophen (NORCO) 5-325 MG tablet Take 1 tablet by mouth every 6 (six) hours as needed. 02/05/18   Maczis, Elmer Sow, PA-C  LORazepam (ATIVAN) 1 MG tablet Take 1 tablet (1 mg total) by mouth 3 (three) times daily as needed for anxiety. 01/24/16   Molpus, John, MD  omeprazole (PRILOSEC) 40 MG capsule Take 40 mg by mouth 2 (two) times daily.    [provider]  ondansetron (ZOFRAN) 4 MG tablet Take 1 tablet (4 mg total) by mouth every 8 (eight) hours as  needed for nausea or vomiting. 02/05/18   Maczis, Elmer Sow, PA-C  ondansetron (ZOFRAN) 4 MG tablet Take 1 tablet (4 mg total) by mouth every 6 (six) hours. 02/05/18   Maczis, Elmer Sow, PA-C  promethazine (PHENERGAN) 25 MG tablet Take 1 tablet (25 mg total) by mouth every 6 (six) hours as needed for nausea or vomiting. 03/09/18   Lawyer, Cristal Deer, PA-C  SUMAtriptan (IMITREX) 100 MG tablet Take 100 mg by mouth every 2 (two) hours as needed for migraine.  10/25/15   [provider]  tamsulosin (FLOMAX) 0.4 MG  CAPS capsule Take 1 capsule (0.4 mg total) by mouth daily after supper. 02/05/18   Maczis, Elmer Sow, PA-C  zolpidem (AMBIEN) 10 MG tablet Take 10 mg by mouth at bedtime.  10/25/15   [provider]    Family History Family History  Problem Relation Age of Onset  . Brain cancer Mother   . Diabetes Father   . Stroke Father     Social History Social History   Tobacco Use  . Smoking status: Never Smoker  . Smokeless tobacco: Never Used  Substance Use Topics  . Alcohol use: Not Currently  . Drug use: Never     Allergies   Amoxicillin; Benicar [olmesartan]; and Sulfa antibiotics   Review of Systems Review of Systems  Constitutional: Positive for chills and fever. Negative for activity change, appetite change and unexpected weight change.  HENT: Positive for congestion and rhinorrhea. Negative for sore throat.   Eyes: Negative for visual disturbance.  Respiratory: Positive for cough. Negative for shortness of breath.   Cardiovascular: Negative for chest pain.  Gastrointestinal: Positive for abdominal pain, diarrhea, nausea and vomiting. Negative for constipation.  Endocrine: Negative for polydipsia, polyphagia and polyuria.  Genitourinary: Negative for dysuria, flank pain and frequency.  Musculoskeletal: Negative for back pain and neck pain.  Skin: Negative for rash.  Neurological: Positive for headaches. Negative for dizziness, syncope, weakness and numbness.  Psychiatric/Behavioral: The patient is not nervous/anxious.      Physical Exam Updated Vital Signs BP (!) 147/79 (BP Location: Right Arm)   Pulse 96   Temp 100 F (37.8 C) (Oral)   Resp 18   Ht 5' 9.5" (1.765 m)   Wt 126.1 kg   SpO2 96%   BMI 40.46 kg/m   Physical Exam Vitals signs and nursing note reviewed. Exam conducted with a chaperone present.  Constitutional:      General: He is not in acute distress.    Appearance: He is well-developed. He is not diaphoretic.  HENT:     Head: Normocephalic  and atraumatic.     Mouth/Throat:     Mouth: Mucous membranes are moist.     Pharynx: No pharyngeal swelling or oropharyngeal exudate.  Eyes:     Extraocular Movements: Extraocular movements intact.     Pupils: Pupils are equal, round, and reactive to light.  Neck:     Musculoskeletal: Normal range of motion and neck supple.  Cardiovascular:     Rate and Rhythm: Normal rate and regular rhythm.     Heart sounds: Normal heart sounds. No murmur. No friction rub. No gallop.   Pulmonary:     Effort: Pulmonary effort is normal. No respiratory distress.     Breath sounds: Normal breath sounds. No wheezing or rales.  Abdominal:     General: Abdomen is protuberant. Bowel sounds are normal. There is no distension.     Palpations: Abdomen is soft. Abdomen is not rigid. There  is no mass.     Tenderness: There is generalized abdominal tenderness (Tenderness is worse in RLQ.) and tenderness in the right lower quadrant. There is guarding. There is no rebound. Positive signs include McBurney's sign.     Hernia: No hernia is present.  Genitourinary:    Prostate: Normal. Not enlarged and not tender.     Rectum: Normal. No tenderness.  Musculoskeletal: Normal range of motion.  Skin:    General: Skin is warm.     Findings: No rash.  Neurological:     Mental Status: He is alert and oriented to person, place, and time.    Mental Status:  Alert, oriented, thought content appropriate, able to give a coherent history. Speech fluent without evidence of aphasia. Able to follow 2 step commands without difficulty.  Cranial Nerves:  II:  Peripheral visual fields grossly normal, pupils equal, round, reactive to light III,IV, VI: ptosis not present, extra-ocular motions intact bilaterally  V,VII: smile symmetric, facial light touch sensation equal VIII: hearing grossly normal to voice  X: uvula elevates symmetrically  XI: bilateral shoulder shrug symmetric and strong XII: midline tongue extension without  fassiculations Motor:  Normal tone. 5/5 in upper and lower extremities bilaterally including strong and equal grip strength and dorsiflexion/plantar flexion Sensory: Pinprick and light touch normal in all extremities.  Deep Tendon Reflexes: 2+ and symmetric in the biceps and patella Cerebellar: normal finger-to-nose with bilateral upper extremities Gait: normal gait and balance.  CV: distal pulses palpable throughout    ED Treatments / Results  Labs (all labs ordered are listed, but only abnormal results are displayed) Labs Reviewed  LACTIC ACID, PLASMA - Abnormal; Notable for the following components:      Result Value   Lactic Acid, Venous 2.1 (*)    All other components within normal limits  COMPREHENSIVE METABOLIC PANEL - Abnormal; Notable for the following components:   Potassium 3.1 (*)    Glucose, Bld 138 (*)    All other components within normal limits  CBC WITH DIFFERENTIAL/PLATELET - Abnormal; Notable for the following components:   Lymphs Abs 0.5 (*)    All other components within normal limits  URINALYSIS, ROUTINE W REFLEX MICROSCOPIC - Abnormal; Notable for the following components:   Hgb urine dipstick MODERATE (*)    All other components within normal limits  URINALYSIS, MICROSCOPIC (REFLEX) - Abnormal; Notable for the following components:   Bacteria, UA RARE (*)    All other components within normal limits  URINE CULTURE  LACTIC ACID, PLASMA    EKG None  Radiology Dg Chest 2 View  Result Date: 07/22/2018 CLINICAL DATA:  Cough for 3 weeks EXAM: CHEST - 2 VIEW COMPARISON:  February 03, 2016 FINDINGS: The heart size and mediastinal contours are within normal limits. Both lungs are clear. The visualized skeletal structures are stable. IMPRESSION: No active cardiopulmonary disease. Electronically Signed   By: Sherian Rein M.D.   On: 07/22/2018 12:04   Ct Abdomen Pelvis W Contrast  Result Date: 07/22/2018 CLINICAL DATA:  Acute generalized abdominal pain,  nausea, vomiting. EXAM: CT ABDOMEN AND PELVIS WITH CONTRAST TECHNIQUE: Multidetector CT imaging of the abdomen and pelvis was performed using the standard protocol following bolus administration of intravenous contrast. CONTRAST:  15mL ISOVUE-300 IOPAMIDOL (ISOVUE-300) INJECTION 61%, OMNIPAQUE IOHEXOL 300 MG/ML SOLN COMPARISON:  CT scan of March 08, 2018. FINDINGS: Lower chest: No acute abnormality. Hepatobiliary: No gallstones or biliary dilatation is noted. Stable small hepatic cysts are noted. Pancreas: Unremarkable. No pancreatic  ductal dilatation or surrounding inflammatory changes. Spleen: Normal in size without focal abnormality. Adrenals/Urinary Tract: Adrenal glands appear normal. Stable left renal cysts are noted. No hydronephrosis or renal obstruction is noted. No renal or ureteral calculi are noted. Urinary bladder is unremarkable. Stomach/Bowel: Stomach is within normal limits. Appendix appears normal. No evidence of bowel wall thickening, distention, or inflammatory changes. Vascular/Lymphatic: No significant vascular findings are present. No enlarged abdominal or pelvic lymph nodes. Reproductive: Stable mild prostatic enlargement. Other: No abdominal wall hernia or abnormality. No abdominopelvic ascites. Musculoskeletal: No acute or significant osseous findings. IMPRESSION: No acute abnormality seen in the abdomen or pelvis. Electronically Signed   By: Lupita Raider, M.D.   On: 07/22/2018 14:32    Procedures Procedures (including critical care time)  Medications Ordered in ED Medications  sodium chloride flush (NS) 0.9 % injection 3 mL (3 mLs Intravenous Not Given 07/22/18 1157)  ibuprofen (ADVIL,MOTRIN) tablet 800 mg (800 mg Oral Given 07/22/18 1147)  ondansetron (ZOFRAN) injection 4 mg (4 mg Intravenous Given 07/22/18 1201)  sodium chloride 0.9 % bolus 1,000 mL (0 mLs Intravenous Stopped 07/22/18 1303)  morphine 4 MG/ML injection 4 mg (4 mg Intravenous Given 07/22/18 1321)  iohexol  (OMNIPAQUE) 300 MG/ML solution 100 mL (100 mLs Intravenous Contrast Given 07/22/18 1401)  iopamidol (ISOVUE-300) 61 % injection 30 mL (15 mLs Oral Contrast Given 07/22/18 1300)     Initial Impression / Assessment and Plan / ED Course  I have reviewed the triage vital signs and the nursing notes.  Pertinent labs & imaging results that were available during my care of the patient were reviewed by me and considered in my medical decision making (see chart for details).  Clinical Course as of Jul 22 1514  Thu Jul 22, 2018  1213 No active cardiopulmonary disease.  DG Chest 2 View [AH]  1245 WBCs are within normal limits.  WBC: 7.6 [AH]  1313 Moderate Hgb, rare bacteria, RBCs, and WBCs noted on UA.  Hgb urine dipstickMarland Kitchen): MODERATE [AH]  1444 Pt reports abdominal pain has improved while in the ER.   [AH]  1503 No acute abnormality seen in the abdomen or pelvis.  CT ABDOMEN PELVIS W CONTRAST [AH]    Clinical Course User Index [AH] Leretha Dykes, PA-C      Patient is nontoxic, nonseptic appearing, in no apparent distress.  Patient's pain and other symptoms adequately managed in emergency department.  Fluid bolus given.  Labs, imaging and vitals reviewed.  Patient does not meet the SIRS or Sepsis criteria.  On repeat exam patient does not have a surgical abdomin and there are no peritoneal signs.  No indication of appendicitis, bowel obstruction, bowel perforation, cholecystitis, or diverticulitis.  UA reveals Hgb, rare bacteria, WBCs, and RBCs. Ordered urine culture. CT abdomen does not reveal an acute abnormality. Lactic acid improved after IVF. Prostate stable and mildly enlarged on CT scan. Suspect symptoms may be due to viral infection vs prostatitis. Discussed taking ibuprofen/tylenol for fever and pain. Advised patient to continue antibiotics as prescribed by urology. Patient discharged and given strict instructions for follow-up with their primary care physician. Advised patient to follow  up with urologist within the next few days. I have also discussed reasons to return immediately to the ER.  Patient expresses understanding and agrees with plan.  Findings and plan of care discussed with supervising physician Dr. Particia Nearing.  Final Clinical Impressions(s) / ED Diagnoses   Final diagnoses:  Generalized abdominal pain  Nausea vomiting and  diarrhea    ED Discharge Orders    None       Glade Stanford 07/22/18 1509    Jacalyn Lefevre, MD 07/22/18 1511    Carlyle Basques [place], PA-C 07/22/18 1516    Jacalyn Lefevre, MD 07/23/18 [phone]

## 2018-07-22 NOTE — ED Triage Notes (Addendum)
C/o generalized abdominal pain and vomiting which began this morning.  Seen by urology yesterday for hematuria, given ABX for possible prostatitis but has not started these yet.

## 2018-07-22 NOTE — ED Notes (Signed)
Pt reports dry cough and throat feels itchy after receiving IVP dye.

## 2018-07-23 DIAGNOSIS — E785 Hyperlipidemia, unspecified: Secondary | ICD-10-CM | POA: Diagnosis not present

## 2018-07-23 DIAGNOSIS — E119 Type 2 diabetes mellitus without complications: Secondary | ICD-10-CM | POA: Diagnosis not present

## 2018-07-23 DIAGNOSIS — I10 Essential (primary) hypertension: Secondary | ICD-10-CM | POA: Diagnosis not present

## 2018-07-23 DIAGNOSIS — G4733 Obstructive sleep apnea (adult) (pediatric): Secondary | ICD-10-CM | POA: Diagnosis not present

## 2018-07-23 LAB — URINE CULTURE: Culture: NO GROWTH

## 2018-07-24 ENCOUNTER — Other Ambulatory Visit: Payer: Self-pay

## 2018-07-24 ENCOUNTER — Emergency Department (HOSPITAL_BASED_OUTPATIENT_CLINIC_OR_DEPARTMENT_OTHER)
Admission: EM | Admit: 2018-07-24 | Discharge: 2018-07-24 | Disposition: A | Discharge status: Home / Self Care | Payer: BLUE CROSS/BLUE SHIELD | Attending: Emergency Medicine | Admitting: Emergency Medicine

## 2018-07-24 ENCOUNTER — Emergency Department (HOSPITAL_BASED_OUTPATIENT_CLINIC_OR_DEPARTMENT_OTHER): Payer: BLUE CROSS/BLUE SHIELD

## 2018-07-24 ENCOUNTER — Encounter (HOSPITAL_BASED_OUTPATIENT_CLINIC_OR_DEPARTMENT_OTHER): Payer: Self-pay

## 2018-07-24 DIAGNOSIS — R319 Hematuria, unspecified: Secondary | ICD-10-CM | POA: Insufficient documentation

## 2018-07-24 DIAGNOSIS — E119 Type 2 diabetes mellitus without complications: Secondary | ICD-10-CM | POA: Insufficient documentation

## 2018-07-24 DIAGNOSIS — Z7982 Long term (current) use of aspirin: Secondary | ICD-10-CM | POA: Diagnosis not present

## 2018-07-24 DIAGNOSIS — I10 Essential (primary) hypertension: Secondary | ICD-10-CM | POA: Diagnosis not present

## 2018-07-24 DIAGNOSIS — R51 Headache: Secondary | ICD-10-CM | POA: Diagnosis not present

## 2018-07-24 DIAGNOSIS — Z79899 Other long term (current) drug therapy: Secondary | ICD-10-CM | POA: Insufficient documentation

## 2018-07-24 DIAGNOSIS — R519 Headache, unspecified: Secondary | ICD-10-CM

## 2018-07-24 LAB — CBC WITH DIFFERENTIAL/PLATELET
Abs Immature Granulocytes: 0.03 10*3/uL (ref 0.00–0.07)
Basophils Absolute: 0 10*3/uL (ref 0.0–0.1)
Basophils Relative: 0 %
Eosinophils Absolute: 0 10*3/uL (ref 0.0–0.5)
Eosinophils Relative: 1 %
HCT: 44.6 % (ref 39.0–52.0)
Hemoglobin: 14.3 g/dL (ref 13.0–17.0)
Immature Granulocytes: 1 %
Lymphocytes Relative: 18 %
Lymphs Abs: 1.1 10*3/uL (ref 0.7–4.0)
MCH: 26.3 pg (ref 26.0–34.0)
MCHC: 32.1 g/dL (ref 30.0–36.0)
MCV: 82 fL (ref 80.0–100.0)
Monocytes Absolute: 0.4 10*3/uL (ref 0.1–1.0)
Monocytes Relative: 7 %
Neutro Abs: 4.4 10*3/uL (ref 1.7–7.7)
Neutrophils Relative %: 73 %
Platelets: 227 10*3/uL (ref 150–400)
RBC: 5.44 MIL/uL (ref 4.22–5.81)
RDW: 14.2 % (ref 11.5–15.5)
WBC: 5.9 10*3/uL (ref 4.0–10.5)
nRBC: 0 % (ref 0.0–0.2)

## 2018-07-24 LAB — BASIC METABOLIC PANEL
Anion gap: 9 (ref 5–15)
BUN: 11 mg/dL (ref 6–20)
CO2: 27 mmol/L (ref 22–32)
Calcium: 9.1 mg/dL (ref 8.9–10.3)
Chloride: 101 mmol/L (ref 98–111)
Creatinine, Ser: 1.24 mg/dL (ref 0.61–1.24)
GFR calc Af Amer: >60 mL/min (ref >60)
GFR calc non Af Amer: >60 mL/min (ref >60)
Glucose, Bld: 117 mg/dL — ABNORMAL HIGH (ref 70–99)
Potassium: 3.1 mmol/L — ABNORMAL LOW (ref 3.5–5.1)
Sodium: 137 mmol/L (ref 135–145)

## 2018-07-24 MED ORDER — DIPHENHYDRAMINE HCL 50 MG/ML IJ SOLN
25.0000 mg | Freq: Once | INTRAMUSCULAR | Status: AC
  Administered 2018-07-24: 25 mg via INTRAVENOUS
  Filled 2018-07-24: qty 1

## 2018-07-24 MED ORDER — MORPHINE SULFATE (PF) 2 MG/ML IV SOLN
2.0000 mg | Freq: Once | INTRAVENOUS | Status: AC
  Administered 2018-07-24: 2 mg via INTRAVENOUS
  Filled 2018-07-24: qty 1

## 2018-07-24 MED ORDER — METOCLOPRAMIDE HCL 5 MG/ML IJ SOLN
10.0000 mg | Freq: Once | INTRAMUSCULAR | Status: AC
  Administered 2018-07-24: 10 mg via INTRAVENOUS
  Filled 2018-07-24: qty 2

## 2018-07-24 MED ORDER — MORPHINE SULFATE (PF) 4 MG/ML IV SOLN
4.0000 mg | Freq: Once | INTRAVENOUS | Status: AC
  Administered 2018-07-24: 4 mg via INTRAVENOUS
  Filled 2018-07-24: qty 1

## 2018-07-24 MED ORDER — SODIUM CHLORIDE 0.9 % IV BOLUS
250.0000 mL | Freq: Once | INTRAVENOUS | Status: AC
  Administered 2018-07-24: 250 mL via INTRAVENOUS

## 2018-07-24 MED ORDER — HYDROCODONE-ACETAMINOPHEN 5-325 MG PO TABS: 1.0000 | ORAL_TABLET | Freq: Four times a day (QID) | ORAL | 0 refills | Status: DC | PRN

## 2018-07-24 MED ORDER — SODIUM CHLORIDE 0.9 % IV BOLUS
1000.0000 mL | Freq: Once | INTRAVENOUS | Status: AC
  Administered 2018-07-24: 1000 mL via INTRAVENOUS

## 2018-07-24 NOTE — Discharge Instructions (Addendum)
You have been diagnosed today with Headache.  At this time there does not appear to be the presence of an emergent medical condition, however there is always the potential for conditions to change. Please read and follow the below instructions.  Please return to the Emergency Department immediately for any new or worsening symptoms or if your symptoms return Please be sure to follow up with your Primary Care Provider this week regarding your visit today; please call their office to schedule an appointment even if you are feeling better for a follow-up visit. Please drink plenty of water and get plenty of rest over the next few days to help with your symptoms.  We advised that you call your primary care doctor on Monday to schedule a follow-up appointment regarding your visit today.  The urgent care documentation notes that they heard carotid bruits on exam, we did not hear these on our exam however please follow-up with your primary care provider next week for further evaluation regarding these. You have been prescribed a short dose of pain medication today. Only use Norco as prescribed for severe pain. Do not drive or operate heavy machinery while taking Norco because it will make you sleepy.  Get help right away if: Your headache becomes severe quickly. Your headache is different from your normal migraine in any way. Your headache gets worse after moderate to intense physical activity. You have repeated vomiting. You have a stiff neck. You have a loss of vision. You have problems with speech. You have pain in the eye or ear. You have muscular weakness or loss of muscle control. You lose your balance or have trouble walking. You feel faint or pass out. You have confusion. You have a seizure. Any new or concerning symptoms  Please read the additional information packets attached to your discharge summary.  Do not take your medicine if  develop an itchy rash, swelling in your mouth or lips,  or difficulty breathing.

## 2018-07-24 NOTE — ED Notes (Signed)
Pt c/o headache x 1 week. Denies dizziness, emesis, states mild nausea. Pt also c/o hematuria. States he is scheduled to see urologist.

## 2018-07-24 NOTE — ED Notes (Signed)
Patient transported to CT 

## 2018-07-24 NOTE — ED Triage Notes (Signed)
Pt reports HA x 1 week. Seen by UC and sent for further eval. Pt was seen earlier this week, had HA then as well.

## 2018-07-24 NOTE — ED Provider Notes (Signed)
[facility] EMERGENCY DEPARTMENT Provider Note   CSN: [id number] Arrival date & time: 07/24/18  1059    History   Chief Complaint Chief Complaint  Patient presents with  . Headache    HPI Carlos Ashford Sr. is a 53 y.o. male with history of migraines, hypertension, diabetes and hyperlipidemia presents today for headache.  Patient sent in from urgent care for CT scan of the head due to his persistent headache.  Patient reports headache x5 days, severe intensity generalized headache without aggravating factors that he describes as a dull throbbing sensation.  Patient has tried hydrocodone with minimal relief of symptoms.  On my evaluation patient describes his headache as his typical migraine however more severe than usual.  Patient was seen in the ER 2 days ago for fever and abdominal pain at that time chest x-ray, CT abdomen pelvis, CBC, CMP and urinalysis were performed.  Patient was discharged with return precautions.  Patient reports that his fever and abdominal pain have subsided however he has still had persistent headache since that time which she had during his previous visit.  Patient denies fever for the past 2 days, he denies vision changes, neck pain/stiffness, difficulty swallowing, numbness/tingling or weakness or any additional symptoms beyond pain. --- I have reviewed urgent care paperwork today which shows that they are concerned because his migraines do not typically keep him awake at night and this headache feels different from his typical migraines and encouraged him to present today for CT scan of the head.    HPI  Past Medical History:  Diagnosis Date  . Colon polyp   . Diabetes mellitus without complication (HCC)   . Hypercholesteremia   . Hypertension   . Kidney stone   . Migraines   . Prostatitis     Patient Active Problem List   Diagnosis Date Noted  . Dehydration   . Fever in adult   . UTI (lower urinary tract infection) 10/29/2015  .  Migraine headache 10/29/2015  . Fever 10/29/2015  . Leukocytosis 10/29/2015  . Hypokalemia 10/29/2015  . Conjunctivitis 10/29/2015    Past Surgical History:  Procedure Laterality Date  . CARDIAC CATHETERIZATION  1995, 2011  . THYROIDECTOMY          Home Medications    Prior to Admission medications   Medication Sig Start Date End Date Taking? Authorizing Provider  acetaminophen (TYLENOL) 325 MG tablet Take 650 mg by mouth every 6 (six) hours as needed for fever.    [provider]  amLODipine (NORVASC) 5 MG tablet Take 5 mg by mouth daily.    [provider]  aspirin EC 81 MG tablet Take 81 mg by mouth daily.    [provider]  atorvastatin (LIPITOR) 20 MG tablet Take 20 mg by mouth daily. Reported on 10/29/2015 10/25/15   [provider]  ciprofloxacin (CIPRO) 500 MG tablet Take 1 tablet (500 mg total) by mouth 2 (two) times daily. 03/09/18   Lawyer, Cristal Deer, PA-C  diphenhydrAMINE (BENADRYL) 25 MG tablet Take 1 tablet (25 mg total) by mouth every 6 (six) hours as needed (Use for migraine, call PCP if no improvement after 2 tablets). 10/31/15   Rolly Salter, MD  hydrochlorothiazide (HYDRODIURIL) 25 MG tablet Take 25 mg by mouth daily.    [provider]  HYDROcodone-acetaminophen (NORCO/VICODIN) 5-325 MG tablet Take 1-2 tablets by mouth every 6 (six) hours as needed. 07/24/18   Harlene Salts A, PA-C  LORazepam (ATIVAN) 1 MG tablet Take  1 tablet (1 mg total) by mouth 3 (three) times daily as needed for anxiety. 01/24/16   Molpus, John, MD  omeprazole (PRILOSEC) 40 MG capsule Take 40 mg by mouth 2 (two) times daily.    [provider]  ondansetron (ZOFRAN) 4 MG tablet Take 1 tablet (4 mg total) by mouth every 8 (eight) hours as needed for nausea or vomiting. 02/05/18   Maczis, Elmer Sow, PA-C  ondansetron (ZOFRAN) 4 MG tablet Take 1 tablet (4 mg total) by mouth every 6 (six) hours. 02/05/18   Maczis, Elmer Sow, PA-C  promethazine  (PHENERGAN) 25 MG tablet Take 1 tablet (25 mg total) by mouth every 6 (six) hours as needed for nausea or vomiting. 03/09/18   Lawyer, Cristal Deer, PA-C  SUMAtriptan (IMITREX) 100 MG tablet Take 100 mg by mouth every 2 (two) hours as needed for migraine.  10/25/15   [provider]  tamsulosin (FLOMAX) 0.4 MG CAPS capsule Take 1 capsule (0.4 mg total) by mouth daily after supper. 02/05/18   Maczis, Elmer Sow, PA-C  zolpidem (AMBIEN) 10 MG tablet Take 10 mg by mouth at bedtime.  10/25/15   [provider]    Family History Family History  Problem Relation Age of Onset  . Brain cancer Mother   . Diabetes Father   . Stroke Father     Social History Social History   Tobacco Use  . Smoking status: Never Smoker  . Smokeless tobacco: Never Used  Substance Use Topics  . Alcohol use: Not Currently  . Drug use: Never     Allergies   Amoxicillin; Benicar [olmesartan]; and Sulfa antibiotics   Review of Systems Review of Systems  Constitutional: Negative for chills and fever.  HENT: Negative.  Negative for facial swelling, sore throat, trouble swallowing and voice change.   Eyes: Negative.  Negative for visual disturbance.  Respiratory: Negative.  Negative for shortness of breath.   Cardiovascular: Negative.  Negative for chest pain.  Gastrointestinal: Negative.  Negative for abdominal pain, nausea and vomiting.  Musculoskeletal: Negative.  Negative for back pain, neck pain and neck stiffness.  Neurological: Positive for headaches. Negative for dizziness, syncope, speech difficulty, weakness, light-headedness and numbness.  All other systems reviewed and are negative.  Physical Exam Updated Vital Signs BP (!) 140/98 (BP Location: Right Arm)   Pulse 76   Temp 98.4 F (36.9 C) (Oral)   Resp 18   Ht 5' 9.5" (1.765 m)   Wt 126.1 kg   SpO2 100%   BMI 40.46 kg/m   Physical Exam Constitutional:      General: He is not in acute distress.    Appearance: Normal  appearance. He is well-developed. He is not ill-appearing or diaphoretic.  HENT:     Head: Normocephalic and atraumatic. No raccoon eyes, Battle's sign, abrasion or contusion.     Jaw: There is normal jaw occlusion. No trismus.     Comments: No pain around temporal arteries    Right Ear: Tympanic membrane, ear canal and external ear normal. No hemotympanum.     Left Ear: Tympanic membrane, ear canal and external ear normal. No hemotympanum.     Ears:     Comments: Hearing grossly intact bilaterally    Nose: Nose normal. No rhinorrhea.     Right Nostril: No epistaxis.     Left Nostril: No epistaxis.     Mouth/Throat:     Lips: Pink.     Mouth: Mucous membranes are moist.  Pharynx: Oropharynx is clear. Uvula midline.  Eyes:     General: Vision grossly intact. Gaze aligned appropriately.     Extraocular Movements: Extraocular movements intact.     Conjunctiva/sclera: Conjunctivae normal.     Pupils: Pupils are equal, round, and reactive to light.     Comments: Visual fields grossly intact bilaterally  Neck:     Musculoskeletal: Full passive range of motion without pain, normal range of motion and neck supple. No neck rigidity.     Trachea: Trachea and phonation normal. No tracheal tenderness or tracheal deviation.     Meningeal: Brudzinski's sign and Kernig's sign absent.  Cardiovascular:     Rate and Rhythm: Normal rate and regular rhythm.     Pulses:          Dorsalis pedis pulses are 2+ on the right side and 2+ on the left side.       Posterior tibial pulses are 2+ on the right side and 2+ on the left side.     Heart sounds: Normal heart sounds.  Pulmonary:     Effort: Pulmonary effort is normal. No respiratory distress.     Breath sounds: Normal breath sounds and air entry.  Abdominal:     General: Bowel sounds are normal. There is no distension.     Palpations: Abdomen is soft.     Tenderness: There is no abdominal tenderness. There is no guarding or rebound.    Musculoskeletal:     Comments: No midline C/T/L spinal tenderness to palpation, no paraspinal muscle tenderness, no deformity, crepitus, or step-off noted. No sign of injury to the neck or back.  Feet:     Right foot:     Protective Sensation: 3 sites tested. 3 sites sensed.     Left foot:     Protective Sensation: 3 sites tested. 3 sites sensed.  Skin:    General: Skin is warm and dry.  Neurological:     Mental Status: He is alert and oriented to person, place, and time.     GCS: GCS eye subscore is 4. GCS verbal subscore is 5. GCS motor subscore is 6.     Comments: Mental Status: Alert, oriented, thought content appropriate, able to give a coherent history. Speech fluent without evidence of aphasia. Able to follow 2 step commands without difficulty. Cranial Nerves: II: Peripheral visual fields grossly normal, pupils equal, round, reactive to light III,IV, VI: ptosis not present, extra-ocular motions intact bilaterally V,VII: smile symmetric, eyebrows raise symmetric, facial light touch sensation equal VIII: hearing grossly normal to voice X: uvula elevates symmetrically XI: bilateral shoulder shrug symmetric and strong XII: midline tongue extension without fassiculations Motor: Normal tone. 5/5 strength in upper and lower extremities bilaterally including strong and equal grip strength and dorsiflexion/plantar flexion Sensory: Sensation intact to light touch in all extremities.Negative Romberg.  Deep Tendon Reflexes: 2+ and symmetric in the biceps and patella Cerebellar: normal finger-to-nose maze with bilateral upper extremities. Normal heel-to -shin balance bilaterally of the lower extremity. No pronator drift.  Normal rapid alternating movements Gait: normal gait and balance CV: distal pulses palpable throughout  Psychiatric:        Mood and Affect: Mood normal.        Behavior: Behavior is cooperative.    ED Treatments / Results  Labs (all labs ordered are listed,  but only abnormal results are displayed) Labs Reviewed  BASIC METABOLIC PANEL - Abnormal; Notable for the following components:      Result Value  Potassium 3.1 (*)    Glucose, Bld 117 (*)    All other components within normal limits  CBC WITH DIFFERENTIAL/PLATELET    EKG None  Radiology Ct Head Wo Contrast  Result Date: 07/24/2018 CLINICAL DATA:  Pt c/o headache since Tuesday, HTN, high blood pressure today, no dizziness or blurred vision, hx migraines, no numbness or weakness, high risk for glaucoma takes drops per pt, no hx stroke EXAM: CT HEAD WITHOUT CONTRAST TECHNIQUE: Contiguous axial images were obtained from the base of the skull through the vertex without intravenous contrast. COMPARISON:  02/03/2016 FINDINGS: Brain: No evidence of acute infarction, hemorrhage, hydrocephalus, extra-axial collection or mass lesion/mass effect. Vascular: No hyperdense vessel or unexpected calcification. Skull: Normal. Negative for fracture or focal lesion. Sinuses/Orbits: Normal globes and orbits. Sinuses and mastoid air cells are clear. Other: None. IMPRESSION: Normal unenhanced CT scan of the brain. Electronically Signed   By: Amie Portlandavid  Ormond M.D.   On: 07/24/2018 11:49    Procedures Procedures (including critical care time)  Medications Ordered in ED Medications  sodium chloride 0.9 % bolus 1,000 mL (0 mLs Intravenous Stopped 07/24/18 1319)  metoCLOPramide (REGLAN) injection 10 mg (10 mg Intravenous Given 07/24/18 1147)  diphenhydrAMINE (BENADRYL) injection 25 mg (25 mg Intravenous Given 07/24/18 1147)  morphine 4 MG/ML injection 4 mg (4 mg Intravenous Given 07/24/18 1329)  morphine 2 MG/ML injection 2 mg (2 mg Intravenous Given 07/24/18 1435)  sodium chloride 0.9 % bolus 250 mL (0 mLs Intravenous Stopped 07/24/18 1513)     Initial Impression / Assessment and Plan / ED Course  I have reviewed the triage vital signs and the nursing notes.  Pertinent labs & imaging results that were available  during my care of the patient were reviewed by me and considered in my medical decision making (see chart for details).    53 year old male with history of migraines presents today at request of urgent care due to his severe headache.  On my evaluation patient reports that this feels like his typical migraine however more severe than usual.  Patient reports that he normally takes hydrocodone which relieves his headache however this has not been working and he ran out of hydrocodone yesterday.  On arrival patient is overall very well-appearing and in no acute distress.  He is ambulatory without assistance or difficulty.  His only complaint is headache today.  Physical Examination reassuring and without focal neuro deficits.  He denies vision changes, neck pain/stiffness, syncope, head trauma, fever the last 2 days.  There is no tenderness around the temporal arteries and no anterior neck tenderness.  Urgent care documentation notes carotid bruits however on my examination here there are no bruits auscultated.  Nevertheless I have encouraged him to follow-up with his primary care provider regarding this.  CT head: IMPRESSION: No acute abnormality seen in the abdomen or pelvis. CBC within normal limits BMP nonacute - Fluid bolus given Reglan, Benadryl and morphine given - Patient reevaluated and states resolution of headache today.  Neuro examination reperformed and is without deficit. The patient denies any neurologic symptoms such as visual changes, focal numbness/weakness, balance problems, confusion, or speech difficulty to suggest a life-threatening intracranial process such as intracranial hemorrhage or mass.  No auscultated bruits today and patient without anterior neck pain, doubt dissection at this time.  The patient has no clotting risk factors thus venous sinus thrombosis is unlikely. No fevers, neck pain or nuchal rigidity to suggest meningitis. Patient is afebrile, non-toxic and well  appearing. Reassuring  neuro exam, normal gait around emergency department multiple times.  No cranial deficits, no speech deficits, negative pronator drift, normal/equal strength to all extremities.  - Patient was seen and evaluated by Dr. Criss Alvine today who agrees with work-up performed and plan of care is to discharge with PCP follow-up next week. - PCP follow up strongly encouraged. I have reviewed return precautions including development of fever, nausea/vomiting or neurologic symptoms, vision changes, confusion, lethargy, difficulty speaking/walking, or other new/worsening/concerning symptoms. Patient states understanding of return precautions. Patient is agreeable to discharge.  Additionally PMP was reviewed which showed a short course was given to the patient of Norco earlier this month however patient reports that he is out, I feel that it is reasonable to provide patient with 4 Norco today however I have informed to the patient and his wife of return precautions and that if his headache is worse or different in any way than normal that they should return to the emergency department.  I also informed him that he may not drive while taking Norco.  At this time there does not appear to be any evidence of an acute emergency medical condition and the patient appears stable for discharge with appropriate outpatient follow up. Diagnosis was discussed with patient who verbalizes understanding of care plan and is agreeable to discharge. I have discussed return precautions with patient and wife at bedside who verbalize understanding of return precautions. Patient strongly encouraged to follow-up with their PCP next week. All questions answered.  Patient has been discharged in good condition.  Patient's case rediscussed with Dr. Criss Alvine who agrees with plan to discharge with follow-up.   Note: Portions of this report may have been transcribed using voice recognition software. Every effort was made to ensure  accuracy; however, inadvertent computerized transcription errors may still be present. Final Clinical Impressions(s) / ED Diagnoses   Final diagnoses:  Nonintractable headache, unspecified chronicity pattern, unspecified headache type    ED Discharge Orders         Ordered    HYDROcodone-acetaminophen (NORCO/VICODIN) 5-325 MG tablet  Every 6 hours PRN     07/24/18 1517           Elizabeth Palau 07/24/18 1539    Pricilla Loveless, MD 07/25/18 [phone]

## 2018-07-24 NOTE — ED Notes (Signed)
ED Provider at bedside. 

## 2018-07-24 NOTE — ED Notes (Signed)
Family at bedside. 

## 2018-07-24 NOTE — ED Notes (Signed)
Pt advised he will need driver once medication morphine administered. PT verbalize understanding and stated son drove him to ED and is driving him home when discharged

## 2018-07-26 DIAGNOSIS — G43909 Migraine, unspecified, not intractable, without status migrainosus: Secondary | ICD-10-CM | POA: Diagnosis not present

## 2018-08-10 DIAGNOSIS — N5201 Erectile dysfunction due to arterial insufficiency: Secondary | ICD-10-CM | POA: Diagnosis not present

## 2018-08-10 DIAGNOSIS — R310 Gross hematuria: Secondary | ICD-10-CM | POA: Diagnosis not present

## 2018-08-13 DIAGNOSIS — G4733 Obstructive sleep apnea (adult) (pediatric): Secondary | ICD-10-CM | POA: Diagnosis not present

## 2018-08-25 DIAGNOSIS — J189 Pneumonia, unspecified organism: Secondary | ICD-10-CM | POA: Diagnosis not present

## 2018-08-25 DIAGNOSIS — R509 Fever, unspecified: Secondary | ICD-10-CM | POA: Diagnosis not present

## 2018-08-25 DIAGNOSIS — R05 Cough: Secondary | ICD-10-CM | POA: Diagnosis not present

## 2018-08-25 DIAGNOSIS — Z880 Allergy status to penicillin: Secondary | ICD-10-CM | POA: Diagnosis not present

## 2018-08-25 DIAGNOSIS — Z882 Allergy status to sulfonamides status: Secondary | ICD-10-CM | POA: Diagnosis not present

## 2018-08-25 DIAGNOSIS — R0602 Shortness of breath: Secondary | ICD-10-CM | POA: Diagnosis not present

## 2018-08-25 DIAGNOSIS — Z20828 Contact with and (suspected) exposure to other viral communicable diseases: Secondary | ICD-10-CM | POA: Diagnosis not present

## 2018-08-25 DIAGNOSIS — Z888 Allergy status to other drugs, medicaments and biological substances status: Secondary | ICD-10-CM | POA: Diagnosis not present

## 2018-08-25 DIAGNOSIS — E119 Type 2 diabetes mellitus without complications: Secondary | ICD-10-CM | POA: Diagnosis not present

## 2018-08-25 DIAGNOSIS — B349 Viral infection, unspecified: Secondary | ICD-10-CM | POA: Diagnosis not present

## 2018-08-25 DIAGNOSIS — E7800 Pure hypercholesterolemia, unspecified: Secondary | ICD-10-CM | POA: Diagnosis not present

## 2018-08-25 DIAGNOSIS — I10 Essential (primary) hypertension: Secondary | ICD-10-CM | POA: Diagnosis not present

## 2018-08-25 DIAGNOSIS — B342 Coronavirus infection, unspecified: Secondary | ICD-10-CM | POA: Diagnosis not present

## 2018-09-13 DIAGNOSIS — G4733 Obstructive sleep apnea (adult) (pediatric): Secondary | ICD-10-CM | POA: Diagnosis not present

## 2018-09-20 DIAGNOSIS — J1289 Other viral pneumonia: Secondary | ICD-10-CM | POA: Diagnosis not present

## 2018-09-20 DIAGNOSIS — I10 Essential (primary) hypertension: Secondary | ICD-10-CM | POA: Diagnosis not present

## 2018-09-20 DIAGNOSIS — E785 Hyperlipidemia, unspecified: Secondary | ICD-10-CM | POA: Diagnosis not present

## 2018-10-07 DIAGNOSIS — U071 COVID-19: Secondary | ICD-10-CM | POA: Diagnosis not present

## 2018-10-07 DIAGNOSIS — R5383 Other fatigue: Secondary | ICD-10-CM | POA: Diagnosis not present

## 2018-10-07 DIAGNOSIS — J1289 Other viral pneumonia: Secondary | ICD-10-CM | POA: Diagnosis not present

## 2018-10-13 DIAGNOSIS — G43009 Migraine without aura, not intractable, without status migrainosus: Secondary | ICD-10-CM | POA: Diagnosis not present

## 2018-10-13 DIAGNOSIS — G4733 Obstructive sleep apnea (adult) (pediatric): Secondary | ICD-10-CM | POA: Diagnosis not present

## 2018-10-25 DIAGNOSIS — I10 Essential (primary) hypertension: Secondary | ICD-10-CM | POA: Diagnosis not present

## 2018-10-25 DIAGNOSIS — E890 Postprocedural hypothyroidism: Secondary | ICD-10-CM | POA: Diagnosis not present

## 2018-10-25 DIAGNOSIS — F419 Anxiety disorder, unspecified: Secondary | ICD-10-CM | POA: Diagnosis not present

## 2018-10-25 DIAGNOSIS — Z0000 Encounter for general adult medical examination without abnormal findings: Secondary | ICD-10-CM | POA: Diagnosis not present

## 2018-10-25 DIAGNOSIS — E538 Deficiency of other specified B group vitamins: Secondary | ICD-10-CM | POA: Diagnosis not present

## 2018-10-25 DIAGNOSIS — G43909 Migraine, unspecified, not intractable, without status migrainosus: Secondary | ICD-10-CM | POA: Diagnosis not present

## 2018-10-25 DIAGNOSIS — Z23 Encounter for immunization: Secondary | ICD-10-CM | POA: Diagnosis not present

## 2018-10-25 DIAGNOSIS — E785 Hyperlipidemia, unspecified: Secondary | ICD-10-CM | POA: Diagnosis not present

## 2018-10-25 DIAGNOSIS — Z8249 Family history of ischemic heart disease and other diseases of the circulatory system: Secondary | ICD-10-CM | POA: Diagnosis not present

## 2018-10-25 DIAGNOSIS — E119 Type 2 diabetes mellitus without complications: Secondary | ICD-10-CM | POA: Diagnosis not present

## 2018-11-05 ENCOUNTER — Telehealth: Payer: Self-pay | Admitting: *Deleted

## 2018-11-05 ENCOUNTER — Telehealth: Payer: Self-pay

## 2018-11-05 ENCOUNTER — Ambulatory Visit: Payer: BC Managed Care – PPO | Admitting: Emergency Medicine

## 2018-11-05 ENCOUNTER — Other Ambulatory Visit: Payer: Self-pay

## 2018-11-05 ENCOUNTER — Encounter: Payer: Self-pay | Admitting: Emergency Medicine

## 2018-11-05 VITALS — BP 136/86 | HR 81 | Temp 98.1°F | Ht 69.5 in | Wt 276.2 lb

## 2018-11-05 DIAGNOSIS — E119 Type 2 diabetes mellitus without complications: Secondary | ICD-10-CM | POA: Diagnosis not present

## 2018-11-05 DIAGNOSIS — E538 Deficiency of other specified B group vitamins: Secondary | ICD-10-CM | POA: Diagnosis not present

## 2018-11-05 DIAGNOSIS — U071 COVID-19: Secondary | ICD-10-CM

## 2018-11-05 DIAGNOSIS — R0609 Other forms of dyspnea: Secondary | ICD-10-CM | POA: Insufficient documentation

## 2018-11-05 DIAGNOSIS — G4733 Obstructive sleep apnea (adult) (pediatric): Secondary | ICD-10-CM | POA: Insufficient documentation

## 2018-11-05 DIAGNOSIS — R0600 Dyspnea, unspecified: Secondary | ICD-10-CM

## 2018-11-05 DIAGNOSIS — D582 Other hemoglobinopathies: Secondary | ICD-10-CM

## 2018-11-05 DIAGNOSIS — Z20822 Contact with and (suspected) exposure to covid-19: Secondary | ICD-10-CM

## 2018-11-05 DIAGNOSIS — E890 Postprocedural hypothyroidism: Secondary | ICD-10-CM | POA: Diagnosis not present

## 2018-11-05 DIAGNOSIS — Z8639 Personal history of other endocrine, nutritional and metabolic disease: Secondary | ICD-10-CM | POA: Diagnosis not present

## 2018-11-05 NOTE — Progress Notes (Signed)
Subjective:    Patient ID: Carlos Blendherome Visser Sr., male    DOB: 01/14/1966, 53 y.o.   MRN: [id number]  HPI 53 yo never smoker, hx HTN, DM, OSA. [place], was exposed to family that was ill. Then he began to get sick April 1, [facility] with [facility], fever, chest discomfort. 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 He went into isolation, was treated with empiric abx. None of his acquaintances or family every got sick.   He feels some better but is not back top baseline, still has myalgias in LE muscles, shoulders. His cough is better, still has a lot of fatigue, dyspnea with significant activity.    He has not been wearing his CPAP during this illness. It is new - hasn't started it.    Review of Systems Past Medical History:  Diagnosis Date  . Colon polyp   . Diabetes mellitus without complication (HCC)   . Hypercholesteremia   . Hypertension   . Kidney stone   . Migraines   . Prostatitis      Family History  Problem Relation Age of Onset  . Brain cancer Mother   . Diabetes Father   . Stroke Father      Social History   Socioeconomic History  . Marital status: Married    Spouse name: Not on file  . Number of children: Not on file  . Years of education: Not on file  . Highest education level: Not on file  Occupational History  . Not on file  Social Needs  . Financial resource strain: Not on file  . Food insecurity    Worry: Not on file    Inability: Not on file  . Transportation needs    Medical: Not on file    Non-medical: Not on file  Tobacco Use  . Smoking status: Never Smoker  . Smokeless tobacco: Never Used  Substance and Sexual Activity  . Alcohol use: Not Currently  . Drug use: Never  . Sexual activity: Not on file  Lifestyle  . Physical activity    Days per week: Not on file    Minutes per session: Not on file  . Stress: Not on file  Relationships  . Social Musicianconnections    Talks on phone: Not on file    Gets together: Not on file   Attends religious service: Not on file    Active member of club or organization: Not on file    Attends meetings of clubs or organizations: Not on file    Relationship status: Not on file  . Intimate partner violence    Fear of current or ex partner: Not on file    Emotionally abused: Not on file    Physically abused: Not on file    Forced sexual activity: Not on file  Other Topics Concern  . Not on file  Social History Narrative  . 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  Was in the [employer]  Allergies  Allergen Reactions  . Amoxicillin     "GI distress"  . Benicar [Olmesartan] Swelling  . Sulfa Antibiotics     Hives      Outpatient Medications Prior to Visit  Medication Sig Dispense Refill  . acetaminophen (TYLENOL) 325 MG tablet Take 650 mg by mouth every 6 (six) hours as needed for fever.    Marland Kitchen. amLODipine (NORVASC) 5 MG tablet Take 5 mg by mouth daily.    .Marland Kitchen  aspirin EC 81 MG tablet Take 81 mg by mouth daily.    Marland Kitchen. atorvastatin (LIPITOR) 20 MG tablet Take 20 mg by mouth daily. Reported on 10/29/2015    . diphenhydrAMINE (BENADRYL) 25 MG tablet Take 1 tablet (25 mg total) by mouth every 6 (six) hours as needed (Use for migraine, call PCP if no improvement after 2 tablets). 30 tablet 0  . hydrochlorothiazide (HYDRODIURIL) 25 MG tablet Take 25 mg by mouth daily.    Marland Kitchen. HYDROcodone-acetaminophen (NORCO/VICODIN) 5-325 MG tablet Take 1-2 tablets by mouth every 6 (six) hours as needed. 4 tablet 0  . LORazepam (ATIVAN) 1 MG tablet Take 1 tablet (1 mg total) by mouth 3 (three) times daily as needed for anxiety. 15 tablet 0  . omeprazole (PRILOSEC) 40 MG capsule Take 40 mg by mouth 2 (two) times daily.    . ondansetron (ZOFRAN) 4 MG tablet Take 1 tablet (4 mg total) by mouth every 6 (six) hours. 12 tablet 0  . SUMAtriptan (IMITREX) 100 MG tablet Take 100 mg by mouth every 2 (two) hours as needed for migraine.     . tamsulosin (FLOMAX) 0.4 MG CAPS capsule Take 1 capsule  (0.4 mg total) by mouth daily after supper. 14 capsule 0  . zolpidem (AMBIEN) 10 MG tablet Take 10 mg by mouth at bedtime.     . promethazine (PHENERGAN) 25 MG tablet Take 1 tablet (25 mg total) by mouth every 6 (six) hours as needed for nausea or vomiting. 10 tablet 0  . ciprofloxacin (CIPRO) 500 MG tablet Take 1 tablet (500 mg total) by mouth 2 (two) times daily. 28 tablet 0  . ondansetron (ZOFRAN) 4 MG tablet Take 1 tablet (4 mg total) by mouth every 8 (eight) hours as needed for nausea or vomiting. 4 tablet 0   No facility-administered medications prior to visit.         Objective:   Physical Exam Vitals:   11/05/18 1410  BP: 136/86  Pulse: 81  Temp: 98.1 F (36.7 C)  TempSrc: Oral  SpO2: 99%  Weight: 276 lb 3.2 oz (125.3 kg)  Height: 5' 9.5" (1.765 m)   Gen: Pleasant, overwt man, in no distress,  normal affect  ENT: No lesions,  mouth clear,  oropharynx clear, no postnasal drip  Neck: No JVD, no stridor  Lungs: No use of accessory muscles, no crackles or wheezing on normal respiration, no wheeze on forced expiration  Cardiovascular: RRR, heart sounds normal, no murmur or gallops, no peripheral edema  Musculoskeletal: No deformities, no cyanosis or clubbing  Neuro: alert, awake, non focal  Skin: Warm, no lesions or rash      Assessment & Plan:  Dyspnea on exertion Continues to have significant dyspnea, some other systemic symptoms following outpatient treatment for COVID-19 pneumonia.  He isolated himself for 2 months, does feel better but certainly not back to baseline.  I tried to reassure him that it could take longer for him to recover from such a significant illness.  I think it would be reasonable to repeat his chest x-ray ensure no evidence of persistent infiltrate, interstitial disease.  Also have recommended pulmonary function testing.  In order to have this he need to have repeat COVID test.  This would have the additional benefit of confirming that he no  longer sheds virus.   Obstructive sleep apnea He is obtained a CPAP but has not started using it yet because he was diagnosed with COVID-19 in the interim.  He did not  want to contaminate his device which is understandable.  Once we establish that he is negative, I have asked him to start using the CPAP.   [name], MD, PhD 11/05/2018, 3:26 PM [facility] [phone] or if no answer [phone]

## 2018-11-05 NOTE — Assessment & Plan Note (Signed)
Continues to have significant dyspnea, some other systemic symptoms following outpatient treatment for COVID-19 pneumonia.  He isolated himself for 2 months, does feel better but certainly not back to baseline.  I tried to reassure him that it could take longer for him to recover from such a significant illness.  I think it would be reasonable to repeat his chest x-ray ensure no evidence of persistent infiltrate, interstitial disease.  Also have recommended pulmonary function testing.  In order to have this he need to have repeat COVID test.  This would have the additional benefit of confirming that he no longer sheds virus.

## 2018-11-05 NOTE — Addendum Note (Signed)
Addended by: Valerie Salts on: 11/05/2018 04:08 PM   Modules accepted: Orders

## 2018-11-05 NOTE — Telephone Encounter (Addendum)
Patient called, left VM to return call to [phone] between the hours 0700-1900 Monday through Friday to schedule Covid Test.    ----- Message from [place] sent at 11/05/2018  2:29 PM EDT ----- Hello!   Dr. [name] from [facility] would like for this patient to be tested for COVID. He originally tested positive back in April at [facility]. Has not been tested since.   Thanks!

## 2018-11-05 NOTE — Patient Instructions (Addendum)
We will perform a CXR  Would recommend that perform pulmonary function testing. You will need a repeat COVID test prior to this test.  Agree with going back to work from home.  As long as your COVID test is negative, you should be able to stop quarantine and go back to using universal precautions (mask, social distancing, hand washing) Once your COVID is confirmed negative, start using your CPAP at night Follow with Dr. Lamonte Sakai in 2 months or sooner if you have any problems.

## 2018-11-05 NOTE — Telephone Encounter (Signed)
I called pt to schedule his COVID-19 test however there was no answer.   I left a message for him to call us back at [phone] and any of the nurses could assist him in scheduling his COVID-19 test.

## 2018-11-05 NOTE — Telephone Encounter (Signed)
  Dr. [name] from [facility] would like for this patient to be tested for COVID. He originally tested positive back in April at [facility]. Has not been tested since.    Pt scheduled for testing at [facility] site for Monday. Testing process reviewed, pt verbalizes understanding.

## 2018-11-05 NOTE — Telephone Encounter (Signed)
-----   Message from Valerie Salts, [place] sent at 11/05/2018  2:29 PM EDT ----- Hello!   Dr. [name] from [facility] would like for this patient to be tested for COVID. He originally tested positive back in April at [facility]. Has not been tested since.   Thanks!

## 2018-11-05 NOTE — Assessment & Plan Note (Signed)
He is obtained a CPAP but has not started using it yet because he was diagnosed with COVID-19 in the interim.  He did not want to contaminate his device which is understandable.  Once we establish that he is negative, I have asked him to start using the CPAP.

## 2018-11-08 ENCOUNTER — Other Ambulatory Visit: Payer: BC Managed Care – PPO

## 2018-11-08 ENCOUNTER — Ambulatory Visit (INDEPENDENT_AMBULATORY_CARE_PROVIDER_SITE_OTHER): Payer: BC Managed Care – PPO

## 2018-11-08 DIAGNOSIS — U071 COVID-19: Secondary | ICD-10-CM | POA: Diagnosis not present

## 2018-11-08 DIAGNOSIS — R6889 Other general symptoms and signs: Secondary | ICD-10-CM | POA: Diagnosis not present

## 2018-11-08 DIAGNOSIS — Z20822 Contact with and (suspected) exposure to covid-19: Secondary | ICD-10-CM

## 2018-11-09 ENCOUNTER — Telehealth: Payer: Self-pay | Admitting: Emergency Medicine

## 2018-11-09 NOTE — Telephone Encounter (Signed)
Pt aware of CXR.   Please let the patient know that his CXR is normal. Thanks.

## 2018-11-10 LAB — NOVEL CORONAVIRUS, NAA: SARS-CoV-2, NAA: NOT DETECTED

## 2018-11-10 NOTE — Telephone Encounter (Signed)
Patient aware of CXR results.  Nothing further at this time.

## 2018-11-13 DIAGNOSIS — G4733 Obstructive sleep apnea (adult) (pediatric): Secondary | ICD-10-CM | POA: Diagnosis not present

## 2018-11-17 DIAGNOSIS — I10 Essential (primary) hypertension: Secondary | ICD-10-CM | POA: Diagnosis not present

## 2018-11-23 ENCOUNTER — Telehealth: Payer: Self-pay | Admitting: Emergency Medicine

## 2018-11-24 NOTE — Telephone Encounter (Signed)
Called and left message on pt vm to call back to schedule PFT - 11/24/2018 -pr

## 2018-11-25 ENCOUNTER — Other Ambulatory Visit: Payer: Self-pay | Admitting: Emergency Medicine

## 2018-11-25 NOTE — Telephone Encounter (Signed)
Patient called back to schedule PFT - scheduled patient on 01/04/2019-pr

## 2018-11-29 DIAGNOSIS — E875 Hyperkalemia: Secondary | ICD-10-CM | POA: Diagnosis not present

## 2018-12-03 DIAGNOSIS — G4733 Obstructive sleep apnea (adult) (pediatric): Secondary | ICD-10-CM | POA: Diagnosis not present

## 2018-12-03 DIAGNOSIS — H40021 Open angle with borderline findings, high risk, right eye: Secondary | ICD-10-CM | POA: Diagnosis not present

## 2018-12-03 DIAGNOSIS — H524 Presbyopia: Secondary | ICD-10-CM | POA: Diagnosis not present

## 2018-12-03 DIAGNOSIS — H18613 Keratoconus, stable, bilateral: Secondary | ICD-10-CM | POA: Diagnosis not present

## 2018-12-03 DIAGNOSIS — H401121 Primary open-angle glaucoma, left eye, mild stage: Secondary | ICD-10-CM | POA: Diagnosis not present

## 2018-12-13 DIAGNOSIS — G4733 Obstructive sleep apnea (adult) (pediatric): Secondary | ICD-10-CM | POA: Diagnosis not present

## 2018-12-27 DIAGNOSIS — I10 Essential (primary) hypertension: Secondary | ICD-10-CM | POA: Diagnosis not present

## 2019-01-01 ENCOUNTER — Other Ambulatory Visit (HOSPITAL_COMMUNITY)
Admission: RE | Admit: 2019-01-01 | Discharge: 2019-01-01 | Disposition: A | Discharge status: Home / Self Care | Payer: BC Managed Care – PPO | Source: Ambulatory Visit | Attending: Emergency Medicine | Admitting: Emergency Medicine

## 2019-01-01 DIAGNOSIS — Z20828 Contact with and (suspected) exposure to other viral communicable diseases: Secondary | ICD-10-CM | POA: Insufficient documentation

## 2019-01-01 DIAGNOSIS — Z01818 Encounter for other preprocedural examination: Secondary | ICD-10-CM | POA: Insufficient documentation

## 2019-01-02 LAB — SARS CORONAVIRUS 2 (TAT 6-24 HRS): SARS Coronavirus 2: NEGATIVE

## 2019-01-04 ENCOUNTER — Other Ambulatory Visit: Payer: Self-pay

## 2019-01-04 ENCOUNTER — Ambulatory Visit (INDEPENDENT_AMBULATORY_CARE_PROVIDER_SITE_OTHER): Payer: BC Managed Care – PPO | Admitting: Emergency Medicine

## 2019-01-04 ENCOUNTER — Encounter: Payer: Self-pay | Admitting: Emergency Medicine

## 2019-01-04 DIAGNOSIS — G4733 Obstructive sleep apnea (adult) (pediatric): Secondary | ICD-10-CM

## 2019-01-04 DIAGNOSIS — R0600 Dyspnea, unspecified: Secondary | ICD-10-CM

## 2019-01-04 DIAGNOSIS — R0609 Other forms of dyspnea: Secondary | ICD-10-CM

## 2019-01-04 LAB — PULMONARY FUNCTION TEST
DL/VA % pred: 138 %
DL/VA: 6.02 ml/min/mmHg/L
DLCO unc % pred: 109 %
DLCO unc: 30.85 ml/min/mmHg
FEF 25-75 Post: 3.13 L/sec
FEF 25-75 Pre: 5.61 L/sec
FEF2575-%Change-Post: -44 %
FEF2575-%Pred-Post: 101 %
FEF2575-%Pred-Pre: 182 %
FEV1-%Change-Post: -2 %
FEV1-%Pred-Post: 101 %
FEV1-%Pred-Pre: 103 %
FEV1-Post: 3.22 L
FEV1-Pre: 3.29 L
FEV1FVC-%Change-Post: -2 %
FEV1FVC-%Pred-Pre: 115 %
FEV6-%Change-Post: 0 %
FEV6-%Pred-Post: 92 %
FEV6-%Pred-Pre: 91 %
FEV6-Post: 3.62 L
FEV6-Pre: 3.59 L
FEV6FVC-%Pred-Post: 103 %
FEV6FVC-%Pred-Pre: 103 %
FVC-%Change-Post: 0 %
FVC-%Pred-Post: 89 %
FVC-%Pred-Pre: 88 %
FVC-Post: 3.62 L
FVC-Pre: 3.59 L
Post FEV1/FVC ratio: 89 %
Post FEV6/FVC ratio: 100 %
Pre FEV1/FVC ratio: 92 %
Pre FEV6/FVC Ratio: 100 %
RV % pred: 107 %
RV: 2.26 L
TLC % pred: 86 %
TLC: 5.92 L

## 2019-01-04 NOTE — Progress Notes (Signed)
Full PFT performed today. °

## 2019-01-04 NOTE — Assessment & Plan Note (Signed)
Continues to recover from COVID pneumonia without any obvious sequela on chest x-ray or pulmonary function testing.  He does still have myalgias and does not feel completely back to normal functional capacity.  I reassured him about this and the recovery process from this significant disease.

## 2019-01-04 NOTE — Assessment & Plan Note (Signed)
He has CPAP that he received from pulmonary and [facility] but he has not started using it yet.  I like to try to start now, use this as time 0 and document compliance in the next 90 days.  We can then provide this information to his insurance carrier

## 2019-01-04 NOTE — Patient Instructions (Signed)
Your pulmonary function testing, chest x-ray are all normal.  This is good news.  Continue to work on your exercise, diet.  I believe you will continue to recover from your COVID pneumonia. We discussed CPAP treatment today.  Start using your CPAP, try to use for several hours each night.  We will follow-up in 2 to 3 months to document your usage. Follow with Dr. Lamonte Sakai in 2 months or sooner if you have any problems.

## 2019-01-04 NOTE — Progress Notes (Signed)
   Subjective:    Patient ID: Carlos Meo Sr., male    DOB: [birth year], 53 y.o.   MRN: [id number]  HPI Carlos Greene never smoker, hx HTN, DM, OSA. He traveled to [facility] in March, was exposed to family that was ill. Then he began to get sick April 1, was seen at [facility] with cough, fever, chest discomfort. 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 He went into isolation, was treated with empiric abx. None of his acquaintances or family every got sick.   He feels some better but is not back top baseline, still has myalgias in LE muscles, shoulders. His cough is better, still has a lot of fatigue, dyspnea with significant activity.    He has not been wearing his CPAP during this illness. It is new - hasn't started it.   ROV 01/04/2019 --this is a follow-up visit for Carlos Greene, never smoker for dyspnea and cough.  He was treated for COVID-19 pneumonia in April and continued to have residual symptoms.  Repeat testing here on 01/01/2019 was negative.  Chest x-ray on 6/Carlos/2020 which I reviewed is normal.  He underwent pulmonary function testing today which I reviewed, shows overall normal airflows and normal lung volumes.  Normal diffusion capacity.  No bronchodilator responsiveness.  He is still having aches and pains in his legs.  As above he also has newly diagnosed obstructive sleep apnea, tells me that he hasn't started using it yet, ordered by Pulmonary in [name].        Objective:   Physical Exam Vitals:   01/04/19 1023  BP: 124/84  Pulse: 60  SpO2: 98%  Weight: 277 lb (125.6 kg)  Height: 5' 9.5" (1.765 m)   Gen: Pleasant, overwt man, in no distress,  normal affect  ENT: No lesions,  mouth clear,  oropharynx clear, no postnasal drip  Neck: No JVD, no stridor  Lungs: No use of accessory muscles, no crackles or wheezing on normal respiration, no wheeze on forced expiration  Cardiovascular: RRR, heart sounds normal, no murmur or gallops, no peripheral edema  Musculoskeletal: No deformities, no  cyanosis or clubbing  Neuro: alert, awake, non focal  Skin: Warm, no lesions or rash      Assessment & Plan:  Obstructive sleep apnea He has CPAP that he received from pulmonary and [place] but he has not started using it yet.  I like to try to start now, use this as time 0 and document compliance in the next 90 days.  We can then provide this information to his insurance carrier  Dyspnea on exertion Continues to recover from COVID pneumonia without any obvious sequela on chest x-ray or pulmonary function testing.  He does still have myalgias and does not feel completely back to normal functional capacity.  I reassured him about this and the recovery process from this significant disease.   [name], MD, PhD 01/04/2019, 10:41 AM  P[facility] [phone] or if no answer [phone]

## 2019-01-06 DIAGNOSIS — R310 Gross hematuria: Secondary | ICD-10-CM | POA: Diagnosis not present

## 2019-01-13 DIAGNOSIS — G4733 Obstructive sleep apnea (adult) (pediatric): Secondary | ICD-10-CM | POA: Diagnosis not present

## 2019-01-14 ENCOUNTER — Other Ambulatory Visit: Payer: Self-pay | Admitting: Urology

## 2019-01-14 DIAGNOSIS — R310 Gross hematuria: Secondary | ICD-10-CM

## 2019-01-18 ENCOUNTER — Encounter (HOSPITAL_COMMUNITY): Payer: Self-pay

## 2019-01-18 ENCOUNTER — Other Ambulatory Visit: Payer: Self-pay

## 2019-01-18 ENCOUNTER — Ambulatory Visit (HOSPITAL_COMMUNITY)
Admission: RE | Admit: 2019-01-18 | Discharge: 2019-01-18 | Disposition: A | Discharge status: Home / Self Care | Payer: BC Managed Care – PPO | Source: Ambulatory Visit | Attending: Urology | Admitting: Urology

## 2019-01-18 DIAGNOSIS — R61 Generalized hyperhidrosis: Secondary | ICD-10-CM | POA: Diagnosis not present

## 2019-01-18 DIAGNOSIS — R51 Headache: Secondary | ICD-10-CM | POA: Diagnosis not present

## 2019-01-18 DIAGNOSIS — N2889 Other specified disorders of kidney and ureter: Secondary | ICD-10-CM | POA: Diagnosis not present

## 2019-01-18 DIAGNOSIS — R52 Pain, unspecified: Secondary | ICD-10-CM | POA: Diagnosis not present

## 2019-01-18 DIAGNOSIS — R1111 Vomiting without nausea: Secondary | ICD-10-CM | POA: Diagnosis not present

## 2019-01-18 DIAGNOSIS — E876 Hypokalemia: Secondary | ICD-10-CM | POA: Diagnosis not present

## 2019-01-18 DIAGNOSIS — R310 Gross hematuria: Secondary | ICD-10-CM | POA: Insufficient documentation

## 2019-01-18 DIAGNOSIS — R202 Paresthesia of skin: Secondary | ICD-10-CM | POA: Diagnosis not present

## 2019-01-18 MED ORDER — SODIUM CHLORIDE (PF) 0.9 % IJ SOLN
INTRAMUSCULAR | Status: AC
  Filled 2019-01-18: qty 50

## 2019-01-18 MED ORDER — SODIUM CHLORIDE 0.9 % IV SOLN
INTRAVENOUS | Status: AC
  Filled 2019-01-18: qty 250

## 2019-01-18 MED ORDER — IOHEXOL 300 MG/ML  SOLN
100.0000 mL | Freq: Once | INTRAMUSCULAR | Status: AC | PRN
  Administered 2019-01-18: 100 mL via INTRAVENOUS

## 2019-02-21 DIAGNOSIS — R1310 Dysphagia, unspecified: Secondary | ICD-10-CM | POA: Diagnosis not present

## 2019-02-21 DIAGNOSIS — B360 Pityriasis versicolor: Secondary | ICD-10-CM | POA: Diagnosis not present

## 2019-02-21 DIAGNOSIS — L509 Urticaria, unspecified: Secondary | ICD-10-CM | POA: Diagnosis not present

## 2019-03-02 DIAGNOSIS — G43009 Migraine without aura, not intractable, without status migrainosus: Secondary | ICD-10-CM | POA: Diagnosis not present

## 2019-03-07 ENCOUNTER — Ambulatory Visit: Payer: BC Managed Care – PPO | Admitting: Emergency Medicine

## 2019-03-11 DIAGNOSIS — I10 Essential (primary) hypertension: Secondary | ICD-10-CM | POA: Diagnosis not present

## 2019-03-11 DIAGNOSIS — E669 Obesity, unspecified: Secondary | ICD-10-CM | POA: Diagnosis not present

## 2019-03-11 DIAGNOSIS — Z86010 Personal history of colonic polyps: Secondary | ICD-10-CM | POA: Diagnosis not present

## 2019-03-11 DIAGNOSIS — R310 Gross hematuria: Secondary | ICD-10-CM | POA: Diagnosis not present

## 2019-03-11 DIAGNOSIS — R1310 Dysphagia, unspecified: Secondary | ICD-10-CM | POA: Diagnosis not present

## 2019-03-14 ENCOUNTER — Other Ambulatory Visit: Payer: Self-pay | Admitting: Gastroenterology

## 2019-03-14 DIAGNOSIS — R1310 Dysphagia, unspecified: Secondary | ICD-10-CM

## 2019-03-17 DIAGNOSIS — B360 Pityriasis versicolor: Secondary | ICD-10-CM | POA: Diagnosis not present

## 2019-03-17 DIAGNOSIS — L509 Urticaria, unspecified: Secondary | ICD-10-CM | POA: Diagnosis not present

## 2019-03-28 DIAGNOSIS — D72819 Decreased white blood cell count, unspecified: Secondary | ICD-10-CM | POA: Diagnosis not present

## 2019-04-01 ENCOUNTER — Ambulatory Visit
Admission: RE | Admit: 2019-04-01 | Discharge: 2019-04-01 | Disposition: A | Discharge status: Home / Self Care | Payer: BC Managed Care – PPO | Source: Ambulatory Visit | Attending: Gastroenterology | Admitting: Gastroenterology

## 2019-04-01 DIAGNOSIS — R1310 Dysphagia, unspecified: Secondary | ICD-10-CM | POA: Diagnosis not present

## 2019-04-25 ENCOUNTER — Ambulatory Visit: Payer: BC Managed Care – PPO | Admitting: Emergency Medicine

## 2019-04-26 DIAGNOSIS — E890 Postprocedural hypothyroidism: Secondary | ICD-10-CM | POA: Diagnosis not present

## 2019-04-26 DIAGNOSIS — E785 Hyperlipidemia, unspecified: Secondary | ICD-10-CM | POA: Diagnosis not present

## 2019-04-26 DIAGNOSIS — I10 Essential (primary) hypertension: Secondary | ICD-10-CM | POA: Diagnosis not present

## 2019-04-26 DIAGNOSIS — G43909 Migraine, unspecified, not intractable, without status migrainosus: Secondary | ICD-10-CM | POA: Diagnosis not present

## 2019-05-06 DIAGNOSIS — E890 Postprocedural hypothyroidism: Secondary | ICD-10-CM | POA: Diagnosis not present

## 2019-05-06 DIAGNOSIS — D582 Other hemoglobinopathies: Secondary | ICD-10-CM | POA: Diagnosis not present

## 2019-05-06 DIAGNOSIS — E119 Type 2 diabetes mellitus without complications: Secondary | ICD-10-CM | POA: Diagnosis not present

## 2019-05-10 DIAGNOSIS — Z1159 Encounter for screening for other viral diseases: Secondary | ICD-10-CM | POA: Diagnosis not present

## 2019-05-13 DIAGNOSIS — R1310 Dysphagia, unspecified: Secondary | ICD-10-CM | POA: Diagnosis not present

## 2019-05-13 DIAGNOSIS — K228 Other specified diseases of esophagus: Secondary | ICD-10-CM | POA: Diagnosis not present

## 2019-05-13 DIAGNOSIS — K2970 Gastritis, unspecified, without bleeding: Secondary | ICD-10-CM | POA: Diagnosis not present

## 2019-05-13 DIAGNOSIS — D130 Benign neoplasm of esophagus: Secondary | ICD-10-CM | POA: Diagnosis not present

## 2019-05-13 DIAGNOSIS — K224 Dyskinesia of esophagus: Secondary | ICD-10-CM | POA: Diagnosis not present

## 2019-05-13 DIAGNOSIS — R933 Abnormal findings on diagnostic imaging of other parts of digestive tract: Secondary | ICD-10-CM | POA: Diagnosis not present

## 2019-05-15 ENCOUNTER — Emergency Department (HOSPITAL_BASED_OUTPATIENT_CLINIC_OR_DEPARTMENT_OTHER)
Admission: EM | Admit: 2019-05-15 | Discharge: 2019-05-15 | Disposition: A | Discharge status: Home / Self Care | Payer: BC Managed Care – PPO | Attending: Emergency Medicine | Admitting: Emergency Medicine

## 2019-05-15 ENCOUNTER — Other Ambulatory Visit: Payer: Self-pay

## 2019-05-15 ENCOUNTER — Encounter (HOSPITAL_BASED_OUTPATIENT_CLINIC_OR_DEPARTMENT_OTHER): Payer: Self-pay

## 2019-05-15 DIAGNOSIS — Z79899 Other long term (current) drug therapy: Secondary | ICD-10-CM | POA: Diagnosis not present

## 2019-05-15 DIAGNOSIS — Z7982 Long term (current) use of aspirin: Secondary | ICD-10-CM | POA: Insufficient documentation

## 2019-05-15 DIAGNOSIS — R519 Headache, unspecified: Secondary | ICD-10-CM | POA: Diagnosis not present

## 2019-05-15 DIAGNOSIS — I10 Essential (primary) hypertension: Secondary | ICD-10-CM | POA: Diagnosis not present

## 2019-05-15 DIAGNOSIS — E119 Type 2 diabetes mellitus without complications: Secondary | ICD-10-CM | POA: Insufficient documentation

## 2019-05-15 MED ORDER — AMLODIPINE BESYLATE 5 MG PO TABS: 10.0000 mg | ORAL_TABLET | Freq: Every day | ORAL | 0 refills | Status: AC

## 2019-05-15 NOTE — Discharge Instructions (Signed)
You should double your amlodipine dose from 5 mg to 10 mg daily for your blood pressure. Keep a daily log of your pressures.

## 2019-05-15 NOTE — ED Provider Notes (Signed)
[facility] EMERGENCY DEPARTMENT Provider Note   CSN: [id number] Arrival date & time: 05/15/19  1748     History Chief Complaint  Patient presents with  . Hypertension    Carlos Letts Sr. is a 53 y.o. male with a history of chronic migraines, hypertension, high cholesterol, presenting to the emergency department with hypertension.  Patient reports he had an esophageal dilation proxy 1 week ago.  He has noticed that his blood pressures been high ever since procedure.  He presents daily blood pressure log which shows blood pressures ranging between 150 and 170 systolic, in the 353-299 diastolic.  Normally tells me he runs around 130/80.  He takes amlodipine 5 mg and HCTZ 25 mg and has had no changes in these meds in months.  Reports to me that he has chronic and frequent headaches.  However for the past week he feels that his headaches have worsened.  He describes them as his migraines but worse,.  He is on home medications for these, but feels they have not been helping as much as usual this week.  Currently does not have any headache.  He says today that he called urgent care and reported his blood pressure and was told to come to our ER.  He has never had a history of a stroke before.  He denies any headache, nausea vomiting or vision changes.  Denies any chest pain.  Denies any difficulty urinating.   HPI     Past Medical History:  Diagnosis Date  . Colon polyp   . Diabetes mellitus without complication (HCC)   . Hypercholesteremia   . Hypertension   . Kidney stone   . Migraines   . Prostatitis     Patient Active Problem List   Diagnosis Date Noted  . Dyspnea on exertion 11/05/2018  . Obstructive sleep apnea 11/05/2018  . Abnormal hemoglobin (Hgb) (HCC) 11/05/2018  . Dehydration   . Fever in adult   . UTI (lower urinary tract infection) 10/29/2015  . Migraine headache 10/29/2015  . Fever 10/29/2015  . Leukocytosis 10/29/2015  . Hypokalemia 10/29/2015    . Conjunctivitis 10/29/2015    Past Surgical History:  Procedure Laterality Date  . CARDIAC CATHETERIZATION  1995, 2011  . THYROIDECTOMY         Family History  Problem Relation Age of Onset  . Brain cancer Mother   . Diabetes Father   . Stroke Father     Social History   Tobacco Use  . Smoking status: Never Smoker  . Smokeless tobacco: Never Used  Substance Use Topics  . Alcohol use: Not Currently  . Drug use: Never    Home Medications Prior to Admission medications   Medication Sig Start Date End Date Taking? Authorizing Provider  acetaminophen (TYLENOL) 325 MG tablet Take 650 mg by mouth every 6 (six) hours as needed for fever.    [provider]  amLODipine (NORVASC) 5 MG tablet Take 2 tablets (10 mg total) by mouth daily. 05/15/19   Terald Sleeper, MD  aspirin EC 81 MG tablet Take 81 mg by mouth daily.    [provider]  atorvastatin (LIPITOR) 20 MG tablet Take 20 mg by mouth daily. Reported on 10/29/2015 10/25/15   [provider]  diphenhydrAMINE (BENADRYL) 25 MG tablet Take 1 tablet (25 mg total) by mouth every 6 (six) hours as needed (Use for migraine, call PCP if no improvement after 2 tablets). 10/31/15   Rolly Salter, MD  hydrochlorothiazide (  HYDRODIURIL) 25 MG tablet Take 25 mg by mouth daily.    [provider]  HYDROcodone-acetaminophen (NORCO/VICODIN) 5-325 MG tablet Take 1-2 tablets by mouth every 6 (six) hours as needed. 07/24/18   Harlene SaltsMorelli, Brandon A, PA-C  LORazepam (ATIVAN) 1 MG tablet Take 1 tablet (1 mg total) by mouth 3 (three) times daily as needed for anxiety. 01/24/16   Molpus, John, MD  omeprazole (PRILOSEC) 40 MG capsule Take 40 mg by mouth 2 (two) times daily.    [provider]  ondansetron (ZOFRAN) 4 MG tablet Take 1 tablet (4 mg total) by mouth every 6 (six) hours. 02/05/18   Maczis, Elmer SowMichael M, PA-C  potassium chloride (K-DUR) 10 MEQ tablet Take 10 mEq by mouth daily. with food 11/09/18   [provider]  SUMAtriptan (IMITREX) 100 MG tablet Take 100 mg by mouth every 2 (two) hours as needed for migraine.  10/25/15   [provider]  tamsulosin (FLOMAX) 0.4 MG CAPS capsule Take 1 capsule (0.4 mg total) by mouth daily after supper. 02/05/18   Maczis, Elmer SowMichael M, PA-C  zolpidem (AMBIEN) 10 MG tablet Take 10 mg by mouth at bedtime.  10/25/15   [provider]    Allergies    Amoxicillin, Benicar [olmesartan], and Sulfa antibiotics  Review of Systems   Review of Systems  Constitutional: Negative for chills and fever.  Respiratory: Negative for cough and shortness of breath.   Cardiovascular: Negative for chest pain and palpitations.  Musculoskeletal: Negative for arthralgias and myalgias.  Neurological: Positive for headaches. Negative for syncope.  Psychiatric/Behavioral: Negative for agitation and confusion.  All other systems reviewed and are negative.   Physical Exam Updated Vital Signs BP (!) 162/114 (BP Location: Right Arm)   Pulse 62   Temp 98.3 F (36.8 C)   Resp 16   Ht 5\' 9"  (1.753 m)   Wt 126.1 kg   SpO2 98%   BMI 41.05 kg/m   Physical Exam Vitals and nursing note reviewed.  Constitutional:      Appearance: He is well-developed.  HENT:     Head: Normocephalic and atraumatic.  Eyes:     Conjunctiva/sclera: Conjunctivae normal.     Pupils: Pupils are equal, round, and reactive to light.  Cardiovascular:     Rate and Rhythm: Normal rate and regular rhythm.     Pulses: Normal pulses.  Pulmonary:     Effort: Pulmonary effort is normal. No respiratory distress.     Breath sounds: Normal breath sounds.  Musculoskeletal:     Cervical back: Normal range of motion and neck supple.  Skin:    General: Skin is warm and dry.  Neurological:     General: No focal deficit present.     Mental Status: He is alert and oriented to person, place, and time.     GCS: GCS eye subscore is 4. GCS verbal subscore is 5. GCS motor subscore is 6.     Cranial  Nerves: Cranial nerves are intact.     Sensory: Sensation is intact.     Motor: Motor function is intact.  Psychiatric:        Mood and Affect: Mood normal.        Behavior: Behavior normal.     ED Results / Procedures / Treatments   Labs (all labs ordered are listed, but only abnormal results are displayed) Labs Reviewed - No data to display  EKG None  Radiology No results found.  Procedures Procedures (including critical care  time)  Medications Ordered in ED Medications - No data to display  ED Course  I have reviewed the triage vital signs and the nursing notes.  Pertinent labs & imaging results that were available during my care of the patient were reviewed by me and considered in my medical decision making (see chart for details).  Well-appearing 53 yo male here with asymptomatic hypertension.  Has had ongoing headaches this week but has no active headache in the ED now.  No CP.  Do not suspect hypertensive emergency.  We discussed doubling his amlodipine dose 5 mg to 10 mg, keeping a BP diary, and f/u with his PCP.  Likewise with his doctor for his migraine medicines.  I have a very low suspicion for stroke, meningitis, or ICH based on this presentation.   Final Clinical Impression(s) / ED Diagnoses Final diagnoses:  Asymptomatic hypertension    Rx / DC Orders ED Discharge Orders         Ordered    amLODipine (NORVASC) 5 MG tablet  Daily     05/15/19 1845           Wyvonnia Dusky, MD 05/15/19 2031

## 2019-05-15 NOTE — ED Triage Notes (Signed)
Pt had a recent esophageal dilitation, states has had high b/p readings at home, past few days, most recent 171/121. Also complains of headache on and off, has hx migraines, taking medications without relief.  Takes amlodipine 5mg  and HCTZ 25mg .  No missed doses recently

## 2019-05-16 DIAGNOSIS — I10 Essential (primary) hypertension: Secondary | ICD-10-CM | POA: Diagnosis not present

## 2019-05-17 ENCOUNTER — Other Ambulatory Visit: Payer: Self-pay

## 2019-05-17 ENCOUNTER — Ambulatory Visit: Payer: BC Managed Care – PPO | Admitting: Primary Care

## 2019-05-17 ENCOUNTER — Encounter: Payer: Self-pay | Admitting: Primary Care

## 2019-05-17 VITALS — BP 138/110 | HR 87 | Temp 98.4°F | Ht 69.5 in | Wt 283.6 lb

## 2019-05-17 DIAGNOSIS — I10 Essential (primary) hypertension: Secondary | ICD-10-CM | POA: Diagnosis not present

## 2019-05-17 DIAGNOSIS — G4733 Obstructive sleep apnea (adult) (pediatric): Secondary | ICD-10-CM

## 2019-05-17 DIAGNOSIS — L723 Sebaceous cyst: Secondary | ICD-10-CM | POA: Diagnosis not present

## 2019-05-17 DIAGNOSIS — L509 Urticaria, unspecified: Secondary | ICD-10-CM | POA: Diagnosis not present

## 2019-05-17 DIAGNOSIS — Z79899 Other long term (current) drug therapy: Secondary | ICD-10-CM | POA: Diagnosis not present

## 2019-05-17 NOTE — Progress Notes (Signed)
@Patient  ID: Carlos Rivero Sr., male    DOB: 05-18-1966, 53 y.o.   MRN: [id number]  Chief Complaint  Patient presents with  . Follow-up    Dyspnea on exertion, high blood pressure, CPAP    Referring provider: [id number], MD  HPI: 53 year old male, never smoked. PMH significant for COVID virus detected, dyspnea, OSA. Patient of Dr. 53, last seen on 01/04/19. Initially OSA was being managed by pulmonary [facility]. He had not start CPAP d/t COVID diagnosis . He was tested for SARS CORONAVIRUS in June and August which were negative. CXR and PFTs normal. Started on CPAP and needs FU in 2-3 months.   05/17/2019 Patient presents today for acute visit, CPAP issues. Over the last 4 days his blood pressure has been elevated. He saw his PCP and told her that he really hasn't started using his CPAP. His Amlodipine and hydrochlorothiazide were increased and Clonidine was added. He attempted to wear CPAP yesterday and felt pressure was too high. States air was blowing so forcefully he could not breath. He has a nasal mask. DME company is [employer], his current pressure setting is 5-15cm h20.  He is not in [place].   Allergies  Allergen Reactions  . Amoxicillin     "GI distress"  . Benicar [Olmesartan] Swelling  . Sulfa Antibiotics     Hives     Immunization History  Administered Date(s) Administered  . Influenza,inj,Quad PF,6-35 Mos 02/08/2019    Past Medical History:  Diagnosis Date  . Colon polyp   . Diabetes mellitus without complication (HCC)   . Hypercholesteremia   . Hypertension   . Kidney stone   . Migraines   . Prostatitis     Tobacco History: Social History   Tobacco Use  Smoking Status Never Smoker  Smokeless Tobacco Never Used   Counseling given: Not Answered   Outpatient Medications Prior to Visit  Medication Sig Dispense Refill  . acetaminophen (TYLENOL) 325 MG tablet Take 650 mg by mouth every 6 (six) hours as needed for fever.    02/10/2019 amLODipine (NORVASC)  5 MG tablet Take 2 tablets (10 mg total) by mouth daily. 60 tablet 0  . aspirin EC 81 MG tablet Take 81 mg by mouth daily.    Marland Kitchen atorvastatin (LIPITOR) 20 MG tablet Take 20 mg by mouth daily. Reported on 10/29/2015    . diphenhydrAMINE (BENADRYL) 25 MG tablet Take 1 tablet (25 mg total) by mouth every 6 (six) hours as needed (Use for migraine, call PCP if no improvement after 2 tablets). 30 tablet 0  . hydrochlorothiazide (HYDRODIURIL) 25 MG tablet Take 25 mg by mouth daily.    12/29/2015 HYDROcodone-acetaminophen (NORCO/VICODIN) 5-325 MG tablet Take 1-2 tablets by mouth every 6 (six) hours as needed. 4 tablet 0  . ondansetron (ZOFRAN) 4 MG tablet Take 1 tablet (4 mg total) by mouth every 6 (six) hours. 12 tablet 0  . potassium chloride (K-DUR) 10 MEQ tablet Take 10 mEq by mouth daily. with food    . SUMAtriptan (IMITREX) 100 MG tablet Take 100 mg by mouth every 2 (two) hours as needed for migraine.     Marland Kitchen LORazepam (ATIVAN) 1 MG tablet Take 1 tablet (1 mg total) by mouth 3 (three) times daily as needed for anxiety. 15 tablet 0  . omeprazole (PRILOSEC) 40 MG capsule Take 40 mg by mouth 2 (two) times daily.    . tamsulosin (FLOMAX) 0.4 MG CAPS capsule Take 1 capsule (0.4 mg total) by  mouth daily after supper. 14 capsule 0  . zolpidem (AMBIEN) 10 MG tablet Take 10 mg by mouth at bedtime.      No facility-administered medications prior to visit.   Review of Systems  Review of Systems  Constitutional: Negative.   Respiratory: Negative.   Cardiovascular: Negative.        Elevated BP readings    Physical Exam  BP (!) 138/110 (BP Location: Right Arm, Cuff Size: Large)   Pulse 87   Temp 98.4 F (36.9 C) (Temporal)   Ht 5' 9.5" (1.765 m)   Wt 283 lb 9.6 oz (128.6 kg)   SpO2 96% Comment: RA  BMI 41.28 kg/m  Physical Exam Constitutional:      Appearance: Normal appearance. He is obese.  HENT:     Head: Normocephalic and atraumatic.     Mouth/Throat:     Mouth: Mucous membranes are moist.      Pharynx: Oropharynx is clear.     Comments: Mallampati class III Neck:     Comments: Thick neck Cardiovascular:     Rate and Rhythm: Normal rate and regular rhythm.  Pulmonary:     Effort: Pulmonary effort is normal.     Breath sounds: Normal breath sounds. No wheezing or rhonchi.     Comments: CTA Skin:    General: Skin is warm and dry.  Neurological:     General: No focal deficit present.     Mental Status: He is alert and oriented to person, place, and time. Mental status is at baseline.  Psychiatric:        Mood and Affect: Mood normal.        Behavior: Behavior normal.        Thought Content: Thought content normal.        Judgment: Judgment normal.      Lab Results:  CBC    Component Value Date/Time   WBC 5.9 07/24/2018 1145   RBC 5.44 07/24/2018 1145   HGB 14.3 07/24/2018 1145   HCT 44.6 07/24/2018 1145   PLT 227 07/24/2018 1145   MCV 82.0 07/24/2018 1145   MCH 26.3 07/24/2018 1145   MCHC 32.1 07/24/2018 1145   RDW 14.2 07/24/2018 1145   LYMPHSABS 1.1 07/24/2018 1145   MONOABS 0.4 07/24/2018 1145   EOSABS 0.0 07/24/2018 1145   BASOSABS 0.0 07/24/2018 1145    BMET    Component Value Date/Time   NA 137 07/24/2018 1320   K 3.1 (L) 07/24/2018 1320   CL 101 07/24/2018 1320   CO2 27 07/24/2018 1320   GLUCOSE 117 (H) 07/24/2018 1320   BUN 11 07/24/2018 1320   CREATININE 1.24 07/24/2018 1320   CALCIUM 9.1 07/24/2018 1320   GFRNONAA >60 07/24/2018 1320   GFRAA >60 07/24/2018 1320    BNP No results found for: BNP  ProBNP No results found for: PROBNP  Imaging: No results found.   Assessment & Plan:   Obstructive sleep apnea - He has really not started using CPAP, tried last night and felt pressure was too high - DME company is [employer], enrolled in [place]  - Adjusted pressure setting to 5-10cm H20  - Encouraged patient to try his best to wear CPAP at night for 4 hours or more - Trial melatonin 10mg  at bedtime to help with sleep - If continues to  have issues with pressure setting or does not tolerate recommend in-lab CPAP titration study and/or mask desensitization  - Needs FU in 2 weeks with download  Hypertension - BP 138/110, managed by PCP - His amlodipine and hydrochlorothiazide were recently increased and he was started on Clonidine    Glenford BayleyElizabeth W Brynn Mulgrew, NP 05/17/2019

## 2019-05-17 NOTE — Patient Instructions (Signed)
Recommendations: We will decrease your pressure setting on your CPAP with [employer]  Try your best to wear CPAP at night for 4 hours If pressure is still too high or you are not able to consistently use- we will go ahead and order CPAP titration study in lab  Follow-up: Televisit in 2 weeks with Dr. Lamonte Sakai or APP   CPAP and BPAP Information CPAP and BPAP are methods of helping a person breathe with the use of air pressure. CPAP stands for "continuous positive airway pressure." BPAP stands for "bi-level positive airway pressure." In both methods, air is blown through your nose or mouth and into your air passages to help you breathe well. CPAP and BPAP use different amounts of pressure to blow air. With CPAP, the amount of pressure stays the same while you breathe in and out. With BPAP, the amount of pressure is increased when you breathe in (inhale) so that you can take larger breaths. Your health care provider will recommend whether CPAP or BPAP would be more helpful for you. Why are CPAP and BPAP treatments used? CPAP or BPAP can be helpful if you have:  Sleep apnea.  Chronic obstructive pulmonary disease (COPD).  Heart failure.  Medical conditions that weaken the muscles of the chest including muscular dystrophy, or neurological diseases such as amyotrophic lateral sclerosis (ALS).  Other problems that cause breathing to be weak, abnormal, or difficult. CPAP is most commonly used for obstructive sleep apnea (OSA) to keep the airways from collapsing when the muscles relax during sleep. How is CPAP or BPAP administered? Both CPAP and BPAP are provided by a small machine with a flexible plastic tube that attaches to a plastic mask. You wear the mask. Air is blown through the mask into your nose or mouth. The amount of pressure that is used to blow the air can be adjusted on the machine. Your health care provider will determine the pressure setting that should be used based on your individual  needs. When should CPAP or BPAP be used? In most cases, the mask only needs to be worn during sleep. Generally, the mask needs to be worn throughout the night and during any daytime naps. People with certain medical conditions may also need to wear the mask at other times when they are awake. Follow instructions from your health care provider about when to use the machine. What are some tips for using the mask?   Because the mask needs to be snug, some people feel trapped or closed-in (claustrophobic) when first using the mask. If you feel this way, you may need to get used to the mask. One way to do this is by holding the mask loosely over your nose or mouth and then gradually applying the mask more snugly. You can also gradually increase the amount of time that you use the mask.  Masks are available in various types and sizes. Some fit over your mouth and nose while others fit over just your nose. If your mask does not fit well, talk with your health care provider about getting a different one.  If you are using a mask that fits over your nose and you tend to breathe through your mouth, a chin strap may be applied to help keep your mouth closed.  The CPAP and BPAP machines have alarms that may sound if the mask comes off or develops a leak.  If you have trouble with the mask, it is very important that you talk with your health care  provider about finding a way to make the mask easier to tolerate. Do not stop using the mask. Stopping the use of the mask could have a negative impact on your health. What are some tips for using the machine?  Place your CPAP or BPAP machine on a secure table or stand near an electrical outlet.  Know where the on/off switch is located on the machine.  Follow instructions from your health care provider about how to set the pressure on your machine and when you should use it.  Do not eat or drink while the CPAP or BPAP machine is on. Food or fluids could get pushed  into your lungs by the pressure of the CPAP or BPAP.  Do not smoke. Tobacco smoke residue can damage the machine.  For home use, CPAP and BPAP machines can be rented or purchased through home health care companies. Many different brands of machines are available. Renting a machine before purchasing may help you find out which particular machine works well for you.  Keep the CPAP or BPAP machine and attachments clean. Ask your health care provider for specific instructions. Get help right away if:  You have redness or open areas around your nose or mouth where the mask fits.  You have trouble using the CPAP or BPAP machine.  You cannot tolerate wearing the CPAP or BPAP mask.  You have pain, discomfort, and bloating in your abdomen. Summary  CPAP and BPAP are methods of helping a person breathe with the use of air pressure.  Both CPAP and BPAP are provided by a small machine with a flexible plastic tube that attaches to a plastic mask.  If you have trouble with the mask, it is very important that you talk with your health care provider about finding a way to make the mask easier to tolerate. This information is not intended to replace advice given to you by your health care provider. Make sure you discuss any questions you have with your health care provider. Document Released: 02/08/2004 Document Revised: 09/01/2018 Document Reviewed: 03/31/2016 Elsevier Patient Education  2020 [employer].

## 2019-05-17 NOTE — Assessment & Plan Note (Signed)
-   BP 138/110, managed by PCP - His amlodipine and hydrochlorothiazide were recently increased and he was started on Clonidine

## 2019-05-17 NOTE — Assessment & Plan Note (Signed)
-   He has really not started using CPAP, tried last night and felt pressure was too high - DME company is [employer], enrolled in [place]  - Adjusted pressure setting to 5-10cm H20  - Encouraged patient to try his best to wear CPAP at night for 4 hours or more - Trial melatonin 10mg  at bedtime to help with sleep - If continues to have issues with pressure setting or does not tolerate recommend in-lab CPAP titration study and/or mask desensitization  - Needs FU in 2 weeks with download

## 2019-05-27 HISTORY — PX: EYE SURGERY: SHX253

## 2019-06-06 ENCOUNTER — Ambulatory Visit: Payer: BC Managed Care – PPO | Admitting: Emergency Medicine

## 2019-06-06 ENCOUNTER — Other Ambulatory Visit: Payer: Self-pay

## 2019-06-06 ENCOUNTER — Encounter: Payer: Self-pay | Admitting: Emergency Medicine

## 2019-06-06 VITALS — BP 138/90 | HR 76 | Temp 97.9°F | Ht 70.0 in | Wt 279.0 lb

## 2019-06-06 DIAGNOSIS — G4733 Obstructive sleep apnea (adult) (pediatric): Secondary | ICD-10-CM | POA: Diagnosis not present

## 2019-06-06 NOTE — Patient Instructions (Signed)
We will arrange for a CPAP titration study in the sleep lab.  This will help confirm which pressure is appropriate for your CPAP.  Will also be able to work on getting an appropriate mask fit to help you use the device more easily. Agree with working with Dr. Katrinka Blazing to optimize your blood pressure medications.  I believe your blood pressure will be easier to manage once we are able to get your CPAP started consistently. Follow with Dr. Delton Coombes in 2 months or sooner if you have any problems.

## 2019-06-06 NOTE — Assessment & Plan Note (Signed)
Difficulty with CPAP compliance.  He has a nasal mask, recently had his auto titration pressure changed to a range of 5-10 cm water.  He has not really tried to use the mask since that change was made.  He is motivated to improve his compliance.  I think he will benefit from a CPAP titration study.  That way we can avoid any pressures higher than his minimum requirement, also can work on optimizing his mask fit and getting a different mask if superior.  Follow-up in about 2 months to assess compliance  We will arrange for a CPAP titration study in the sleep lab.  This will help confirm which pressure is appropriate for your CPAP.  Will also be able to work on getting an appropriate mask fit to help you use the device more easily. Agree with working with Dr. Katrinka Blazing to optimize your blood pressure medications.  I believe your blood pressure will be easier to manage once we are able to get your CPAP started consistently. Follow with Dr. Delton Coombes in 2 months or sooner if you have any problems.

## 2019-06-06 NOTE — Progress Notes (Signed)
Subjective:    Patient ID: Carlos Blend Sr., male    DOB: 1966/02/03, 54 y.o.   MRN: [id number]  HPI 54 y.o. never smoker, hx HTN, DM, OSA. He traveled to [facility] in March, was exposed to family that was ill. Then he began to get sick April 1, was seen at [facility] with cough, fever, chest discomfort. 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 He went into isolation, was treated with empiric abx. None of his acquaintances or family every got sick.   He feels some better but is not back top baseline, still has myalgias in LE muscles, shoulders. His cough is better, still has a lot of fatigue, dyspnea with significant activity.    He has not been wearing his CPAP during this illness. It is new - hasn't started it.   ROV 01/04/2019 --this is a follow-up visit for 54 year old gentleman, never smoker for dyspnea and cough.  He was treated for COVID-19 pneumonia in April and continued to have residual symptoms.  Repeat testing here on 01/01/2019 was negative.  Chest x-ray on 11/08/2018 which I reviewed is normal.  He underwent pulmonary function testing today which I reviewed, shows overall normal airflows and normal lung volumes.  Normal diffusion capacity.  No bronchodilator responsiveness.  He is still having aches and pains in his legs.  As above he also has newly diagnosed obstructive sleep apnea, tells me that he hasn't started using it yet, ordered by Pulmonary in [facility].  ROV 06/06/19 --54 year old man, never smoker whom I have seen for dyspnea following his diagnosis with COVID-19 in April 2019.  Subsequent chest x-ray negative.  He has normal pulmonary function testing.  He does have obstructive sleep apnea and has been working on starting CPAP. He has felt like the mask is blowing too hard. His range was recently decreased to 5-10 cm H2O, but he hasn't tried it yet.  He also has significant HTN and has been having his meds actively adjusted.        Objective:   Physical Exam Vitals:   06/06/19 1624  BP:  138/90  Pulse: 76  Temp: 97.9 F (36.6 C)  TempSrc: Temporal  SpO2: 98%  Weight: 279 lb (126.6 kg)  Height: 5\' 10"  (1.778 m)   Gen: Pleasant, overwt man, in no distress,  normal affect  ENT: No lesions,  mouth clear,  oropharynx clear, no postnasal drip  Neck: No JVD, no stridor  Lungs: No use of accessory muscles, no crackles or wheezing on normal respiration, no wheeze on forced expiration  Cardiovascular: RRR, heart sounds normal, no murmur or gallops, no peripheral edema  Musculoskeletal: No deformities, no cyanosis or clubbing  Neuro: alert, awake, non focal  Skin: Warm, no lesions or rash      Assessment & Plan:  Obstructive sleep apnea Difficulty with CPAP compliance.  He has a nasal mask, recently had his auto titration pressure changed to a range of 5-10 cm water.  He has not really tried to use the mask since that change was made.  He is motivated to improve his compliance.  I think he will benefit from a CPAP titration study.  That way we can avoid any pressures higher than his minimum requirement, also can work on optimizing his mask fit and getting a different mask if superior.  Follow-up in about 2 months to assess compliance  We will arrange for a CPAP titration study in the sleep lab.  This will help confirm which pressure is appropriate for  your CPAP.  Will also be able to work on getting an appropriate mask fit to help you use the device more easily. Agree with working with Dr. Tamala Julian to optimize your blood pressure medications.  I believe your blood pressure will be easier to manage once we are able to get your CPAP started consistently. Follow with Dr. Lamonte Sakai in 2 months or sooner if you have any problems.      [name], MD, PhD 06/06/2019, 4:55 PM [facility] [phone] or if no answer [phone]

## 2019-06-14 DIAGNOSIS — Z6841 Body Mass Index (BMI) 40.0 and over, adult: Secondary | ICD-10-CM | POA: Diagnosis not present

## 2019-06-14 DIAGNOSIS — G43009 Migraine without aura, not intractable, without status migrainosus: Secondary | ICD-10-CM | POA: Diagnosis not present

## 2019-06-14 DIAGNOSIS — I10 Essential (primary) hypertension: Secondary | ICD-10-CM | POA: Diagnosis not present

## 2019-06-21 ENCOUNTER — Other Ambulatory Visit (HOSPITAL_COMMUNITY)
Admission: RE | Admit: 2019-06-21 | Discharge: 2019-06-21 | Disposition: A | Discharge status: Home / Self Care | Payer: BC Managed Care – PPO | Source: Ambulatory Visit | Attending: Pulmonary Disease | Admitting: Pulmonary Disease

## 2019-06-21 DIAGNOSIS — Z01812 Encounter for preprocedural laboratory examination: Secondary | ICD-10-CM | POA: Insufficient documentation

## 2019-06-21 DIAGNOSIS — Z20822 Contact with and (suspected) exposure to covid-19: Secondary | ICD-10-CM | POA: Diagnosis not present

## 2019-06-22 LAB — SARS CORONAVIRUS 2 (TAT 6-24 HRS): SARS Coronavirus 2: NEGATIVE

## 2019-06-24 ENCOUNTER — Other Ambulatory Visit: Payer: Self-pay

## 2019-06-24 ENCOUNTER — Ambulatory Visit (HOSPITAL_BASED_OUTPATIENT_CLINIC_OR_DEPARTMENT_OTHER): Payer: BC Managed Care – PPO | Attending: Emergency Medicine | Admitting: Pulmonary Disease

## 2019-06-29 ENCOUNTER — Telehealth: Payer: Self-pay | Admitting: Emergency Medicine

## 2019-06-29 DIAGNOSIS — G4733 Obstructive sleep apnea (adult) (pediatric): Secondary | ICD-10-CM

## 2019-06-29 NOTE — Telephone Encounter (Signed)
Received a fax from the sleep center stating the the pt was unable to complete his sleep study due to not being able to tolerate the CPAP mask. The sleep center is asking for an order to be placed for a mask desensitization.  RB - can we place this order?

## 2019-06-29 NOTE — Telephone Encounter (Signed)
Yes Ok to place order for this

## 2019-06-29 NOTE — Telephone Encounter (Signed)
I have placed order for mask desensitization

## 2019-07-07 DIAGNOSIS — R809 Proteinuria, unspecified: Secondary | ICD-10-CM | POA: Diagnosis not present

## 2019-07-21 DIAGNOSIS — H25813 Combined forms of age-related cataract, bilateral: Secondary | ICD-10-CM | POA: Diagnosis not present

## 2019-07-21 DIAGNOSIS — H40023 Open angle with borderline findings, high risk, bilateral: Secondary | ICD-10-CM | POA: Diagnosis not present

## 2019-07-23 ENCOUNTER — Other Ambulatory Visit (HOSPITAL_COMMUNITY)
Admission: RE | Admit: 2019-07-23 | Discharge: 2019-07-23 | Disposition: A | Discharge status: Home / Self Care | Payer: BC Managed Care – PPO | Source: Ambulatory Visit | Attending: Pulmonary Disease | Admitting: Pulmonary Disease

## 2019-07-23 DIAGNOSIS — Z20822 Contact with and (suspected) exposure to covid-19: Secondary | ICD-10-CM | POA: Diagnosis not present

## 2019-07-23 DIAGNOSIS — Z01812 Encounter for preprocedural laboratory examination: Secondary | ICD-10-CM | POA: Diagnosis not present

## 2019-07-23 LAB — SARS CORONAVIRUS 2 (TAT 6-24 HRS): SARS Coronavirus 2: NEGATIVE

## 2019-07-26 ENCOUNTER — Other Ambulatory Visit: Payer: Self-pay

## 2019-07-26 ENCOUNTER — Ambulatory Visit (HOSPITAL_BASED_OUTPATIENT_CLINIC_OR_DEPARTMENT_OTHER): Payer: BC Managed Care – PPO | Attending: Emergency Medicine | Admitting: Radiology

## 2019-07-26 DIAGNOSIS — G4733 Obstructive sleep apnea (adult) (pediatric): Secondary | ICD-10-CM

## 2019-10-11 DIAGNOSIS — G43009 Migraine without aura, not intractable, without status migrainosus: Secondary | ICD-10-CM | POA: Diagnosis not present

## 2019-10-11 DIAGNOSIS — I10 Essential (primary) hypertension: Secondary | ICD-10-CM | POA: Diagnosis not present

## 2019-10-28 DIAGNOSIS — R1310 Dysphagia, unspecified: Secondary | ICD-10-CM | POA: Diagnosis not present

## 2019-10-28 DIAGNOSIS — K219 Gastro-esophageal reflux disease without esophagitis: Secondary | ICD-10-CM | POA: Diagnosis not present

## 2019-10-28 DIAGNOSIS — Z86010 Personal history of colonic polyps: Secondary | ICD-10-CM | POA: Diagnosis not present

## 2019-11-14 DIAGNOSIS — E785 Hyperlipidemia, unspecified: Secondary | ICD-10-CM | POA: Diagnosis not present

## 2019-11-14 DIAGNOSIS — I10 Essential (primary) hypertension: Secondary | ICD-10-CM | POA: Diagnosis not present

## 2019-11-14 DIAGNOSIS — K219 Gastro-esophageal reflux disease without esophagitis: Secondary | ICD-10-CM | POA: Diagnosis not present

## 2019-11-14 DIAGNOSIS — Z0000 Encounter for general adult medical examination without abnormal findings: Secondary | ICD-10-CM | POA: Diagnosis not present

## 2019-11-14 DIAGNOSIS — E538 Deficiency of other specified B group vitamins: Secondary | ICD-10-CM | POA: Diagnosis not present

## 2019-11-14 DIAGNOSIS — E890 Postprocedural hypothyroidism: Secondary | ICD-10-CM | POA: Diagnosis not present

## 2019-11-14 DIAGNOSIS — F419 Anxiety disorder, unspecified: Secondary | ICD-10-CM | POA: Diagnosis not present

## 2019-11-14 DIAGNOSIS — Z5181 Encounter for therapeutic drug level monitoring: Secondary | ICD-10-CM | POA: Diagnosis not present

## 2019-11-15 DIAGNOSIS — L509 Urticaria, unspecified: Secondary | ICD-10-CM | POA: Diagnosis not present

## 2019-11-15 DIAGNOSIS — L738 Other specified follicular disorders: Secondary | ICD-10-CM | POA: Diagnosis not present

## 2019-12-12 DIAGNOSIS — E890 Postprocedural hypothyroidism: Secondary | ICD-10-CM | POA: Diagnosis not present

## 2019-12-12 DIAGNOSIS — I10 Essential (primary) hypertension: Secondary | ICD-10-CM | POA: Diagnosis not present

## 2019-12-12 DIAGNOSIS — E119 Type 2 diabetes mellitus without complications: Secondary | ICD-10-CM | POA: Diagnosis not present

## 2019-12-12 DIAGNOSIS — D582 Other hemoglobinopathies: Secondary | ICD-10-CM | POA: Diagnosis not present

## 2020-06-19 IMAGING — CT CT ABDOMEN AND PELVIS WITHOUT AND WITH CONTRAST
3 of 13 series · 11 of 46 positions shown, 15 images · IV contrast (omnipaque)
Comparison: CT the abdomen and pelvis 07/22/2018.

CLINICAL DATA: 53-year-old male with history of gross hematuria
since 01/09/2019.

EXAM:
CT ABDOMEN AND PELVIS WITHOUT AND WITH CONTRAST
TECHNIQUE: Multidetector CT imaging of the abdomen and pelvis was performed
following the standard protocol before and following the bolus
administration of intravenous contrast.
CONTRAST:  100mL OMNIPAQUE IOHEXOL 300 MG/ML  SOLN

[Series 8: lung delay · axial · delayed · 0.85mm/px · z∈[-640,-244]mm · 7 of 265 slices shown]
[im 34/265  bone]
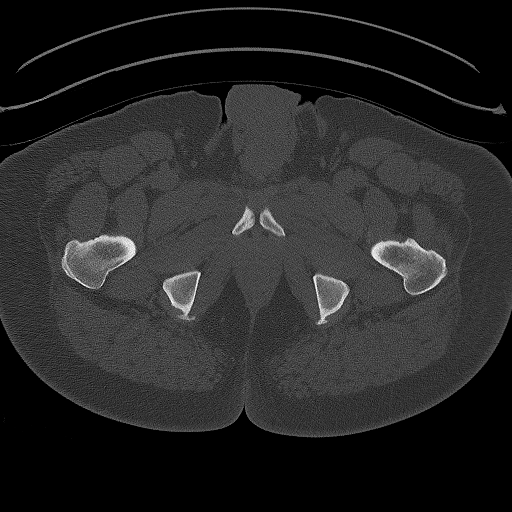
[im 67/265  bone]
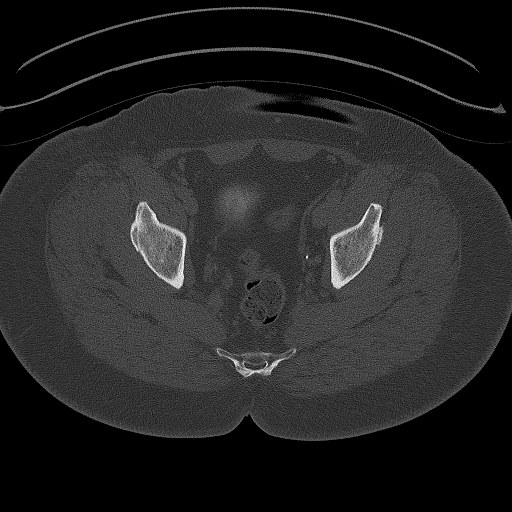
[im 100/265  bone]
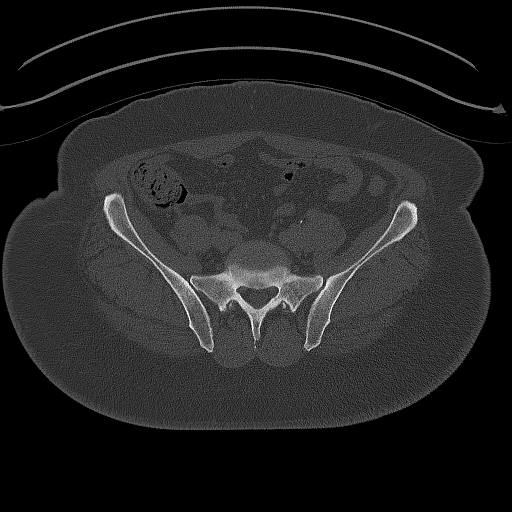
[im 133/265  bone]
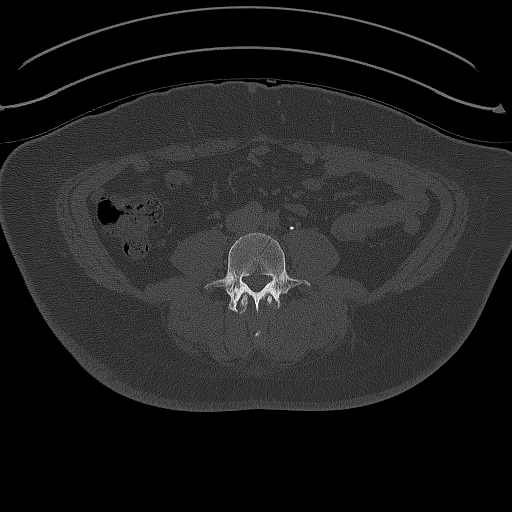
[im 166/265  bone]
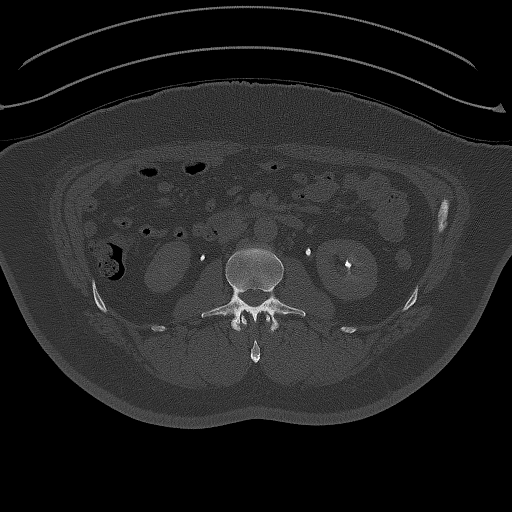
[im 199/265  bone]
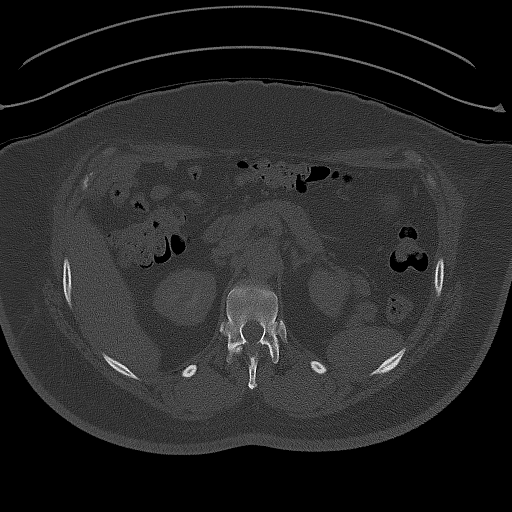
[im 232/265  bone]
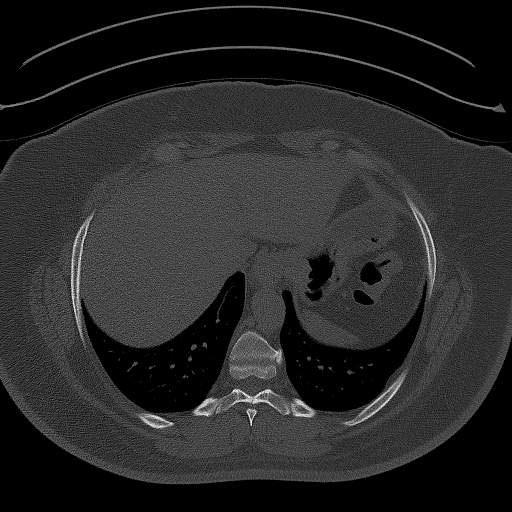

[Series 15: axial delay · axial · delayed · 0.85mm/px · z∈[-528,-353]mm · 2 of 106 slices shown, 5 images]
[im 36/106  soft-tissue]
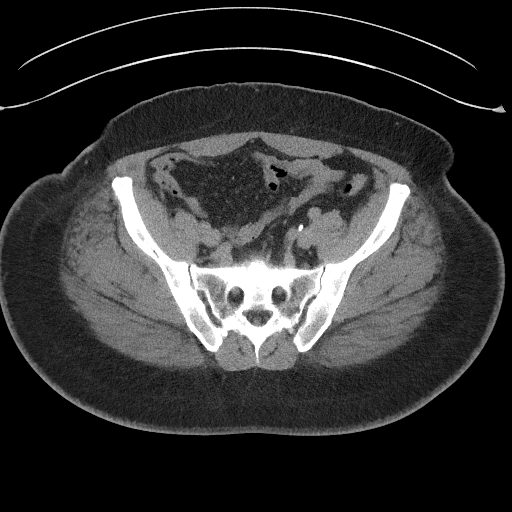
[im 36/106  lung]
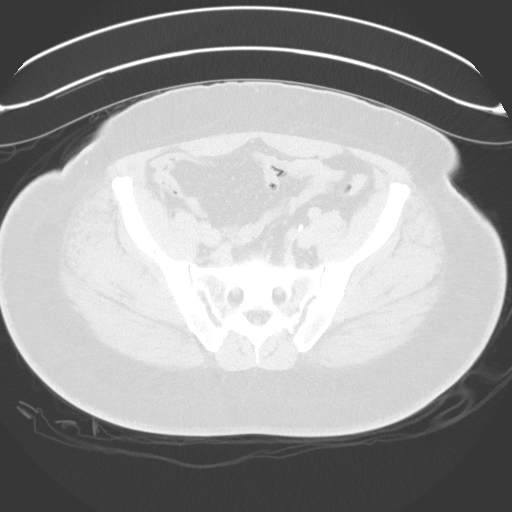
[im 36/106  bone]
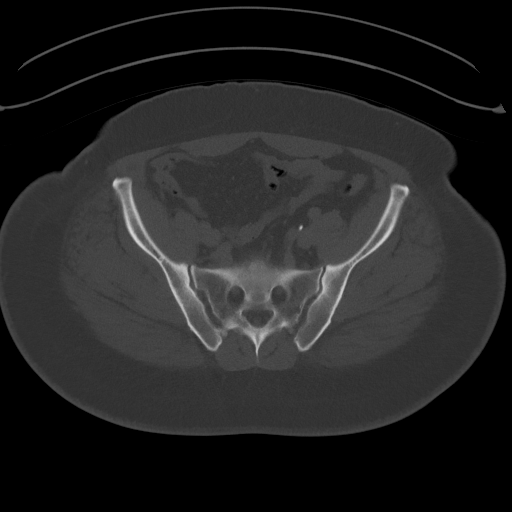
[im 71/106  soft-tissue]
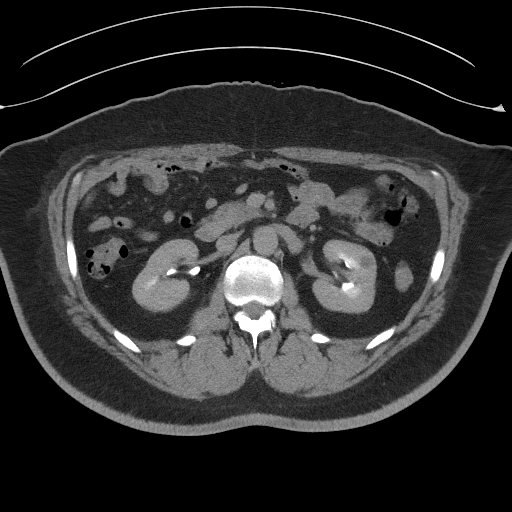
[im 71/106  lung]
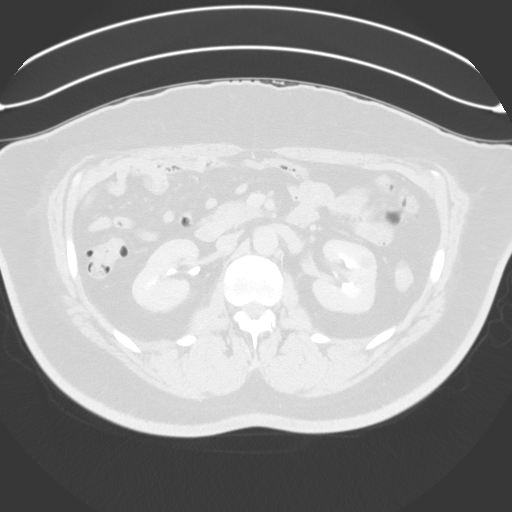

[Series 17: coronal delay · coronal · delayed · 0.75mm/px · 2 of 101 slices shown, 3 images]
[im 34/101  soft-tissue]
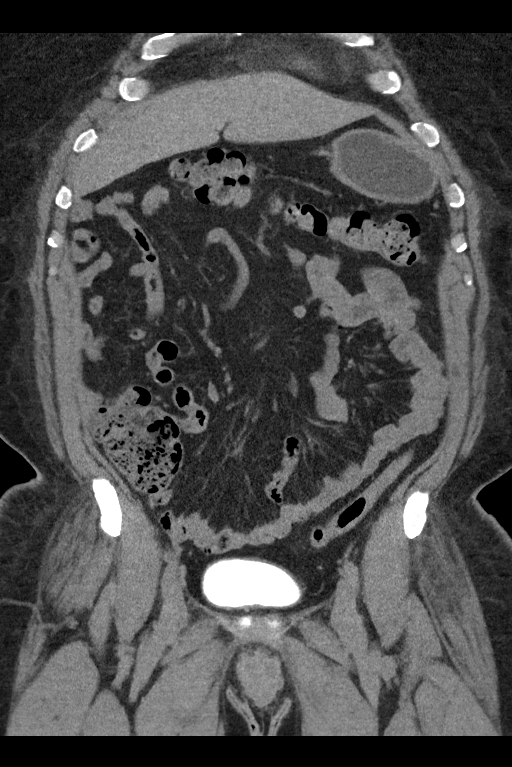
[im 34/101  bone]
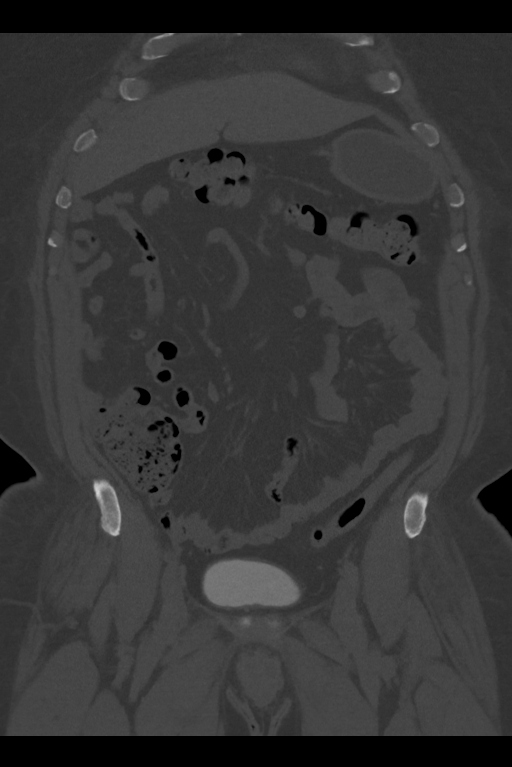
[im 67/101  soft-tissue]
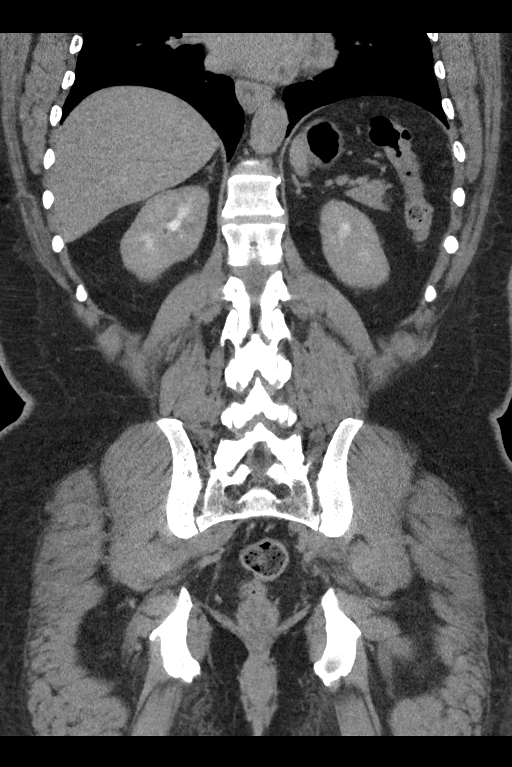

[11 of 46 positions shown; findings below may reference images not displayed]

FINDINGS: Lower chest: Unremarkable.

Hepatobiliary: Several subcentimeter low-attenuation lesions are
again noted scattered throughout the hepatic parenchyma, unchanged
compared to the prior study but too small to characterize;
statistically likely to represent small cysts or biliary hamartomas.
No larger more suspicious appearing hepatic lesions. No intra or
extrahepatic biliary ductal dilatation. Gallbladder is normal in
appearance.

Pancreas: No pancreatic mass. No pancreatic ductal dilatation. No
pancreatic or peripancreatic fluid collections or inflammatory
changes.

Spleen: Unremarkable.

Adrenals/Urinary Tract: No calcifications are identified within the
collecting system of either kidney, along the course of either
ureter, or within the lumen of the urinary bladder. No
hydroureteronephrosis. Right kidney and bilateral adrenal glands are
normal in appearance. Subcentimeter low-attenuation lesions in the
left kidney are too small to definitively characterize, but favored
to represent tiny cysts. Postcontrast delayed images demonstrate no
definite filling defect within the collecting system of either
kidney, along the well opacified portions of either ureter (the
middle and distal thirds of the right ureter is incompletely
opacified), or within the lumen of the urinary bladder to strongly
suggest the presence of urothelial neoplasm at this time. Urinary
bladder is normal in appearance.

Stomach/Bowel: Normal appearance of the stomach. No pathologic
dilatation of small bowel or colon. Normal appendix.

Vascular/Lymphatic: Minimal atherosclerotic disease noted in the
pelvic vasculature. No aneurysm or dissection noted in the abdominal
or pelvic vasculature. No lymphadenopathy noted in the abdomen or
pelvis.

Reproductive: Prostate gland and seminal vesicles are unremarkable
in appearance.

Other: No significant volume of ascites.  No pneumoperitoneum.

Musculoskeletal: There are no aggressive appearing lytic or blastic
lesions noted in the visualized portions of the skeleton.
IMPRESSION: 1. No definite explanation for gross hematuria identified on today's
examination. There are subcentimeter low-attenuation lesions in the
left kidney. Statistically, these are likely to represent tiny
cysts. Should the patient's hematuria persist or worsen, definitive
characterization with nonemergent MRI of the abdomen with and
without IV gadolinium should be considered.
2. Mild atherosclerosis.

## 2020-06-29 ENCOUNTER — Emergency Department (HOSPITAL_BASED_OUTPATIENT_CLINIC_OR_DEPARTMENT_OTHER)
Admission: EM | Admit: 2020-06-29 | Discharge: 2020-06-29 | Disposition: A | Discharge status: Home / Self Care | Payer: BC Managed Care – PPO | Attending: Emergency Medicine | Admitting: Emergency Medicine

## 2020-06-29 ENCOUNTER — Other Ambulatory Visit: Payer: Self-pay

## 2020-06-29 ENCOUNTER — Encounter (HOSPITAL_BASED_OUTPATIENT_CLINIC_OR_DEPARTMENT_OTHER): Payer: Self-pay | Admitting: Emergency Medicine

## 2020-06-29 DIAGNOSIS — E876 Hypokalemia: Secondary | ICD-10-CM | POA: Insufficient documentation

## 2020-06-29 DIAGNOSIS — I10 Essential (primary) hypertension: Secondary | ICD-10-CM | POA: Diagnosis not present

## 2020-06-29 DIAGNOSIS — E119 Type 2 diabetes mellitus without complications: Secondary | ICD-10-CM | POA: Insufficient documentation

## 2020-06-29 DIAGNOSIS — N501 Vascular disorders of male genital organs: Secondary | ICD-10-CM | POA: Insufficient documentation

## 2020-06-29 LAB — CBC WITH DIFFERENTIAL/PLATELET
Abs Immature Granulocytes: 0.22 10*3/uL — ABNORMAL HIGH (ref 0.00–0.07)
Basophils Absolute: 0 10*3/uL (ref 0.0–0.1)
Basophils Relative: 1 %
Eosinophils Absolute: 0.1 10*3/uL (ref 0.0–0.5)
Eosinophils Relative: 1 %
HCT: 40 % (ref 39.0–52.0)
Hemoglobin: 13.5 g/dL (ref 13.0–17.0)
Immature Granulocytes: 4 %
Lymphocytes Relative: 42 %
Lymphs Abs: 2.2 10*3/uL (ref 0.7–4.0)
MCH: 27.1 pg (ref 26.0–34.0)
MCHC: 33.8 g/dL (ref 30.0–36.0)
MCV: 80.2 fL (ref 80.0–100.0)
Monocytes Absolute: 0.5 10*3/uL (ref 0.1–1.0)
Monocytes Relative: 9 %
Neutro Abs: 2.2 10*3/uL (ref 1.7–7.7)
Neutrophils Relative %: 43 %
Platelets: 307 10*3/uL (ref 150–400)
RBC: 4.99 MIL/uL (ref 4.22–5.81)
RDW: 14.6 % (ref 11.5–15.5)
Smear Review: NORMAL
WBC: 5 10*3/uL (ref 4.0–10.5)
nRBC: 0 % (ref 0.0–0.2)

## 2020-06-29 LAB — BASIC METABOLIC PANEL
Anion gap: 13 (ref 5–15)
BUN: 14 mg/dL (ref 6–20)
CO2: 28 mmol/L (ref 22–32)
Calcium: 9.2 mg/dL (ref 8.9–10.3)
Chloride: 99 mmol/L (ref 98–111)
Creatinine, Ser: 1.18 mg/dL (ref 0.61–1.24)
GFR, Estimated: >60 mL/min (ref >60)
Glucose, Bld: 206 mg/dL — ABNORMAL HIGH (ref 70–99)
Potassium: 2.8 mmol/L — ABNORMAL LOW (ref 3.5–5.1)
Sodium: 140 mmol/L (ref 135–145)

## 2020-06-29 LAB — PROTIME-INR
INR: 1 (ref 0.8–1.2)
Prothrombin Time: 12.5 seconds (ref 11.4–15.2)

## 2020-06-29 MED ORDER — POTASSIUM CHLORIDE CRYS ER 20 MEQ PO TBCR: 40.0000 meq | EXTENDED_RELEASE_TABLET | Freq: Every day | ORAL | 0 refills | Status: DC

## 2020-06-29 NOTE — ED Triage Notes (Signed)
Reports when he got in the shower this morning noticed blood in the shower and bathroom floor.  Wife found an area from the scrotum that appeared to be the source.  Recently put on antibiotics for Prostatitis.

## 2020-06-29 NOTE — Discharge Instructions (Signed)
You were seen today in the emergency department with bleeding from the scrotum.  I do not see an area of active bleeding but it looks as if there was a spot where the skin became thin and there was some bleeding earlier.  We have placed a special gauze dressing in the area to prevent any additional bleeding.  If bleeding does return please hold pressure to the area by pinching the skin and hold for 15 minutes without removing your hand.  If this does not stop the bleeding return to the emergency department.  In your blood work today the potassium is low and I am starting you on tablets for the next 5 days.  Please mention this to your primary care doctor so they can repeat blood work in the next week.  You may also consider calling your urologist for a follow-up appointment given the bleeding from the scrotum.

## 2020-06-29 NOTE — ED Provider Notes (Signed)
Emergency Department Provider Note   I have reviewed the triage vital signs and the nursing notes.   HISTORY  Chief Complaint No chief complaint on file.   HPI Carlos Morrissey Sr. is a 55 y.o. male with past medical history reviewed below currently on 81 mg aspirin presents to the emergency department with painless scrotal bleeding which occurred in the shower this morning.  Patient denies any scratching to the area prior to bleeding or other trauma.  He looked down and noticed there was blood in the shower water and describes it as heavy.  He applied toilet paper to the area to try and stop the bleeding and presented to the emergency department.  He has been feeling a little bit lightheaded since the bleeding started.  He denies any testicular pain.  He is currently being treated for prostatitis with antibiotics.  He notes that since having Covid in 2020 he has been more prone to bleeding with just minor scratching.  He was noticing an area on his shoulder in the past 24 hours that had led to some mild bleeding but nothing to this extent.     Past Medical History:  Diagnosis Date  . Colon polyp   . Diabetes mellitus without complication (HCC)   . Hypercholesteremia   . Hypertension   . Kidney stone   . Migraines   . Prostatitis     Patient Active Problem List   Diagnosis Date Noted  . Hypertension 05/17/2019  . Dyspnea on exertion 11/05/2018  . Obstructive sleep apnea 11/05/2018  . Abnormal hemoglobin (Hgb) (HCC) 11/05/2018  . Dehydration   . Fever in adult   . UTI (lower urinary tract infection) 10/29/2015  . Migraine headache 10/29/2015  . Fever 10/29/2015  . Leukocytosis 10/29/2015  . Hypokalemia 10/29/2015  . Conjunctivitis 10/29/2015    Past Surgical History:  Procedure Laterality Date  . CARDIAC CATHETERIZATION  1995, 2011  . THYROIDECTOMY      Allergies Amoxicillin, Benicar [olmesartan], and Sulfa antibiotics  Family History  Problem Relation Age of  Onset  . Brain cancer Mother   . Diabetes Father   . Stroke Father     Social History Social History   Tobacco Use  . Smoking status: Never Smoker  . Smokeless tobacco: Never Used  Vaping Use  . Vaping Use: Never used  Substance Use Topics  . Alcohol use: Not Currently  . Drug use: Never    Review of Systems  Constitutional: No fever/chills Cardiovascular: Denies chest pain. Respiratory: Denies shortness of breath. Gastrointestinal: No abdominal pain.   Genitourinary: Negative for dysuria. Musculoskeletal: Negative for back pain. Skin: Negative for rash. Bleeding from scrotum.  Neurological: Negative for headaches.  10-point ROS otherwise negative.  ____________________________________________   PHYSICAL EXAM:  VITAL SIGNS: ED Triage Vitals  Enc Vitals Group     BP 06/29/20 1001 (!) 153/105     Pulse Rate 06/29/20 1001 85     Resp 06/29/20 1001 18     Temp 06/29/20 1001 97.7 F (36.5 C)     Temp Source 06/29/20 1001 Oral     SpO2 06/29/20 1001 99 %     Weight 06/29/20 1002 278 lb (126.1 kg)     Height 06/29/20 1002 5' 9.5" (1.765 m)   Constitutional: Alert and oriented. Well appearing and in no acute distress. Eyes: Conjunctivae are normal.  Head: Atraumatic. Nose: No congestion/rhinnorhea. Mouth/Throat: Mucous membranes are moist.  Neck: No stridor.   Cardiovascular: Normal rate, regular rhythm.  Good peripheral circulation. Grossly normal heart sounds.   Respiratory: Normal respiratory effort.  No retractions. Lungs CTAB. Gastrointestinal: Soft and nontender. No distention.  Genitourinary: Exam performed with patient's verbal consent.  Normal external genitalia.  Some bloodstained toilet tissue adherent to the left scrotum.  This was removed and no active bleeding was appreciated.  There is a punctate area that looks recently clotted.  No significant surrounding skin breakdown.  No cellulitis.  No lacerations.  The testicles are non-tender with normal lie.  No severe excoriation.  Musculoskeletal: No gross deformities of extremities. Neurologic:  Normal speech and language. Skin:  Skin is warm, dry and intact. No rash noted.  ____________________________________________   LABS (all labs ordered are listed, but only abnormal results are displayed)  Labs Reviewed  CBC WITH DIFFERENTIAL/PLATELET - Abnormal; Notable for the following components:      Result Value   Abs Immature Granulocytes 0.22 (*)    All other components within normal limits  BASIC METABOLIC PANEL - Abnormal; Notable for the following components:   Potassium 2.8 (*)    Glucose, Bld 206 (*)    All other components within normal limits  PROTIME-INR  URINALYSIS, ROUTINE W REFLEX MICROSCOPIC   ______________________________________  RADIOLOGY  None  ____________________________________________   PROCEDURES  Procedure(s) performed:   Procedures  None  ____________________________________________   INITIAL IMPRESSION / ASSESSMENT AND PLAN / ED COURSE  Pertinent labs & imaging results that were available during my care of the patient were reviewed by me and considered in my medical decision making (see chart for details).   Patient presents to the emergency department with atraumatic scrotal bleeding this morning in the shower.  There is no active bleeding here at this time.  There is an area in the left scrotum that may be a potential source of bleeding.  It is punctate with no significant surrounding excoriation or laceration.  The suspected wound is hemostatic.  No scrotal tenderness or masses appreciated.  Patient has had some abnormal bleeding with minor scratching at other locations in his body.  I do not see an indication for advanced imaging of the testicles or scrotum at this time.  Will obtain blood work including CBC and INR along with UA.   11:30 AM  Lab work is overall reassuring.  Patient does have a potassium of 2.8 and I have called in some oral  potassium supplement to the pharmacy to take over the next 5 days.  On reevaluation I do not see any active bleeding or oozing from any part of the scrotum or inner thighs.  I have provided some combat gauze as a dressing to hold in place in case some venous ooze developed.  I do not see an area amenable to cauterization or other hemostatic agent.  Advised patient to pinch and hold pressure if bleeding returns and if it does not stop return to the emergency department.  He will call his PCP for follow-up appointment for the hypokalemia and touch base with his urologist regarding the bleeding.  He will need to continue aspirin with his cardiac history. ____________________________________________  FINAL CLINICAL IMPRESSION(S) / ED DIAGNOSES  Final diagnoses:  Scrotal bleeding  Hypokalemia    NEW OUTPATIENT MEDICATIONS STARTED DURING THIS VISIT:  New Prescriptions   POTASSIUM CHLORIDE SA (KLOR-CON) 20 MEQ TABLET    Take 2 tablets (40 mEq total) by mouth daily for 5 days.    Note:  This document was prepared using Conservation officer, historic buildings and may  include unintentional dictation errors.  Alona Bene, MD, [facility] Emergency Medicine    Laurina Fischl, Arlyss Repress, MD 06/29/20 1134

## 2020-06-29 NOTE — ED Notes (Signed)
ED Provider at bedside. Dr. Long 

## 2020-09-10 ENCOUNTER — Telehealth: Payer: Self-pay | Admitting: Emergency Medicine

## 2020-09-10 NOTE — Telephone Encounter (Signed)
I have called the pt and LM on VM to make him aware that RB is out of the office until Monday.     Pt stated that his PCP is writing a note for him to be out of work and wanted to see if RB would write one as well for what he see's the pt for.  RB please advise. Thanks   Pt was last seen on 06/06/2019 He has no pending appts with RB.

## 2020-09-17 NOTE — Telephone Encounter (Signed)
Called and spoke with patient. He verbalized understanding of RB's recs. I reminded him that RB only sees him for OSA. 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 There is no more social distancing. Masks are no longer mandated.   I asked him if he had any more breathing concerns since his OV back in 2021, he stated that he has not.   I also reminded him that since it has been a year since he was last seen, he would need an OV. He is ok with doing an OV but wants to know if RB would be ok with it being a televisit. I advised him that I would check before getting him scheduled.   RB, please advise. Thanks.

## 2020-09-17 NOTE — Telephone Encounter (Signed)
OK to set him up with a video or phone visit for his OSA. Thanks.

## 2020-09-17 NOTE — Telephone Encounter (Signed)
I follow him for OSA and CPAP. He has normal PFT post-COVID. I don;'t think I can write him out of work.

## 2020-09-17 NOTE — Telephone Encounter (Signed)
Left message for patient to call back  

## 2020-09-18 NOTE — Telephone Encounter (Signed)
lmtcb for pt.   Televisit/video visit with RB first available for OSA.

## 2020-09-20 NOTE — Telephone Encounter (Signed)
Called and left detailed message for pt to call back to schedule an appt. Will close encounter since we have attempted to reach him several times without success.

## 2020-10-23 ENCOUNTER — Emergency Department (HOSPITAL_BASED_OUTPATIENT_CLINIC_OR_DEPARTMENT_OTHER): Payer: BC Managed Care – PPO

## 2020-10-23 ENCOUNTER — Emergency Department (HOSPITAL_BASED_OUTPATIENT_CLINIC_OR_DEPARTMENT_OTHER)
Admission: EM | Admit: 2020-10-23 | Discharge: 2020-10-23 | Disposition: A | Discharge status: Home / Self Care | Payer: BC Managed Care – PPO | Attending: Emergency Medicine | Admitting: Emergency Medicine

## 2020-10-23 ENCOUNTER — Encounter (HOSPITAL_BASED_OUTPATIENT_CLINIC_OR_DEPARTMENT_OTHER): Payer: Self-pay | Admitting: Emergency Medicine

## 2020-10-23 ENCOUNTER — Other Ambulatory Visit: Payer: Self-pay

## 2020-10-23 DIAGNOSIS — J069 Acute upper respiratory infection, unspecified: Secondary | ICD-10-CM | POA: Insufficient documentation

## 2020-10-23 DIAGNOSIS — Z20822 Contact with and (suspected) exposure to covid-19: Secondary | ICD-10-CM | POA: Insufficient documentation

## 2020-10-23 DIAGNOSIS — R059 Cough, unspecified: Secondary | ICD-10-CM | POA: Diagnosis present

## 2020-10-23 DIAGNOSIS — Z7982 Long term (current) use of aspirin: Secondary | ICD-10-CM | POA: Diagnosis not present

## 2020-10-23 DIAGNOSIS — I10 Essential (primary) hypertension: Secondary | ICD-10-CM | POA: Insufficient documentation

## 2020-10-23 DIAGNOSIS — Z7984 Long term (current) use of oral hypoglycemic drugs: Secondary | ICD-10-CM | POA: Diagnosis not present

## 2020-10-23 DIAGNOSIS — Z79899 Other long term (current) drug therapy: Secondary | ICD-10-CM | POA: Insufficient documentation

## 2020-10-23 DIAGNOSIS — E119 Type 2 diabetes mellitus without complications: Secondary | ICD-10-CM | POA: Diagnosis not present

## 2020-10-23 LAB — RESP PANEL BY RT-PCR (FLU A&B, COVID) ARPGX2
Influenza A by PCR: NEGATIVE
Influenza B by PCR: NEGATIVE
SARS Coronavirus 2 by RT PCR: NEGATIVE

## 2020-10-23 MED ORDER — BENZONATATE 100 MG PO CAPS: 100.0000 mg | ORAL_CAPSULE | Freq: Three times a day (TID) | ORAL | 0 refills | Status: DC

## 2020-10-23 NOTE — Discharge Instructions (Signed)
Your chest x-ray did not show a pneumonia.  Please follow-up with your family doctor in the office.  Return for worsening difficulty breathing or confusion or fever.

## 2020-10-23 NOTE — ED Triage Notes (Signed)
Pt reports productive cough x 1 week, worse this morning

## 2020-10-23 NOTE — ED Provider Notes (Signed)
[facility] EMERGENCY DEPARTMENT Provider Note   CSN: [id number] Arrival date & time: 10/23/20  9485     History Chief Complaint  Patient presents with  . Cough    Carlos Tarlton Sr. is a 55 y.o. male.  55yo M with a cc of a cough.  Going on for about a week now.  Taking Mucinex and having some significant sputum production.  Denies fevers or chills.  Denies difficulty breathing.  Had event this evening where he woke up and felt like he was choking on his sputum.  Has resolved.  Had COVID earlier in the pandemic.  Did a home test that was negative.  Denies nausea vomiting or diarrhea.  Sore throat.  He denies any congestion.  He felt like sometimes he coughed up dark mucus and fell like it was intermixed with blood.  Also blew his nose and had some blood come out.  The history is provided by the patient and the spouse.  Cough Associated symptoms: no chest pain, no chills, no eye discharge, no fever, no headaches, no myalgias, no rash and no shortness of breath   Illness Severity:  Mild Onset quality:  Gradual Duration:  1 week Timing:  Constant Progression:  Unchanged Chronicity:  New Associated symptoms: cough   Associated symptoms: no abdominal pain, no chest pain, no congestion, no diarrhea, no fever, no headaches, no myalgias, no rash, no shortness of breath and no vomiting        Past Medical History:  Diagnosis Date  . Colon polyp   . Diabetes mellitus without complication (HCC)   . Hypercholesteremia   . Hypertension   . Kidney stone   . Migraines   . Prostatitis     Patient Active Problem List   Diagnosis Date Noted  . Hypertension 05/17/2019  . Dyspnea on exertion 11/05/2018  . Obstructive sleep apnea 11/05/2018  . Abnormal hemoglobin (Hgb) (HCC) 11/05/2018  . Dehydration   . Fever in adult   . UTI (lower urinary tract infection) 10/29/2015  . Migraine headache 10/29/2015  . Fever 10/29/2015  . Leukocytosis 10/29/2015  . Hypokalemia  10/29/2015  . Conjunctivitis 10/29/2015    Past Surgical History:  Procedure Laterality Date  . CARDIAC CATHETERIZATION  1995, 2011  . THYROIDECTOMY         Family History  Problem Relation Age of Onset  . Brain cancer Mother   . Diabetes Father   . Stroke Father     Social History   Tobacco Use  . Smoking status: Never Smoker  . Smokeless tobacco: Never Used  Vaping Use  . Vaping Use: Never used  Substance Use Topics  . Alcohol use: Not Currently  . Drug use: Never    Home Medications Prior to Admission medications   Medication Sig Start Date End Date Taking? Authorizing Provider  benzonatate (TESSALON) 100 MG capsule Take 1 capsule (100 mg total) by mouth every 8 (eight) hours. 10/23/20  Yes Melene Plan, DO  acetaminophen (TYLENOL) 325 MG tablet Take 650 mg by mouth every 6 (six) hours as needed for fever.    [provider]  amLODipine (NORVASC) 5 MG tablet Take 2 tablets (10 mg total) by mouth daily. 05/15/19   Terald Sleeper, MD  aspirin EC 81 MG tablet Take 81 mg by mouth daily.    [provider]  atorvastatin (LIPITOR) 20 MG tablet Take 20 mg by mouth daily. Reported on 10/29/2015 10/25/15   [provider]  cloNIDine (CATAPRES)  0.1 MG tablet Take 0.1 mg by mouth 2 (two) times daily.    [provider]  diphenhydrAMINE (BENADRYL) 25 MG tablet Take 1 tablet (25 mg total) by mouth every 6 (six) hours as needed (Use for migraine, call PCP if no improvement after 2 tablets). 10/31/15   Rolly Salter, MD  hydrochlorothiazide (HYDRODIURIL) 25 MG tablet Take 37.5 mg by mouth daily. Patient taking 37.5 mg(tablet and a half) in the morning    [provider]  HYDROcodone-acetaminophen (NORCO/VICODIN) 5-325 MG tablet Take 1-2 tablets by mouth every 6 (six) hours as needed. 07/24/18   Bill Salinas, PA-C  metFORMIN (GLUCOPHAGE-XR) 500 MG 24 hr tablet Take 1,500 mg by mouth daily. 05/02/20   [provider]  ondansetron  (ZOFRAN) 4 MG tablet Take 1 tablet (4 mg total) by mouth every 6 (six) hours. 02/05/18   Maczis, Elmer Sow, PA-C  potassium chloride (K-DUR) 10 MEQ tablet Take 10 mEq by mouth daily. with food 11/09/18   [provider]  potassium chloride SA (KLOR-CON) 20 MEQ tablet Take 2 tablets (40 mEq total) by mouth daily for 5 days. 06/29/20 07/04/20  Long, Arlyss Repress, MD  SUMAtriptan (IMITREX) 100 MG tablet Take 100 mg by mouth every 2 (two) hours as needed for migraine.  10/25/15   [provider]  TRULICITY 0.75 MG/0.5ML SOPN SMARTSIG:0.5 Milliliter(s) SUB-Q Once a Week 10/18/20   [provider]    Allergies    Amoxicillin, Benicar [olmesartan], and Sulfa antibiotics  Review of Systems   Review of Systems  Constitutional: Negative for chills and fever.  HENT: Negative for congestion and facial swelling.   Eyes: Negative for discharge and visual disturbance.  Respiratory: Positive for cough. Negative for shortness of breath.   Cardiovascular: Negative for chest pain and palpitations.  Gastrointestinal: Negative for abdominal pain, diarrhea and vomiting.  Musculoskeletal: Negative for arthralgias and myalgias.  Skin: Negative for color change and rash.  Neurological: Negative for tremors, syncope and headaches.  Psychiatric/Behavioral: Negative for confusion and dysphoric mood.    Physical Exam Updated Vital Signs BP (!) 144/94   Pulse 78   Temp 98.2 F (36.8 C) (Oral)   Resp 18   Ht 5' 9.5" (1.765 m)   Wt 127 kg   SpO2 100%   BMI 40.76 kg/m   Physical Exam Vitals and nursing note reviewed.  Constitutional:      Appearance: He is well-developed.     Comments: BMI 41  HENT:     Head: Normocephalic and atraumatic.     Comments: Swollen turbinates, posterior nasal drip Eyes:     Pupils: Pupils are equal, round, and reactive to light.  Neck:     Vascular: No JVD.  Cardiovascular:     Rate and Rhythm: Normal rate and regular rhythm.     Heart sounds: No murmur  heard. No friction rub. No gallop.   Pulmonary:     Effort: No respiratory distress.     Breath sounds: No wheezing.  Abdominal:     General: There is no distension.     Tenderness: There is no guarding or rebound.  Musculoskeletal:        General: Normal range of motion.     Cervical back: Normal range of motion and neck supple.  Skin:    Coloration: Skin is not pale.     Findings: No rash.  Neurological:     Mental Status: He is alert and oriented to person, place, and time.  Psychiatric:        Behavior: Behavior normal.     ED Results / Procedures / Treatments   Labs (all labs ordered are listed, but only abnormal results are displayed) Labs Reviewed  RESP PANEL BY RT-PCR (FLU A&B, COVID) [id number]    EKG None  Radiology DG Chest Port 1 View  Result Date: 10/23/2020 CLINICAL DATA:  55 year old male with increasing productive cough for 1 week. EXAM: PORTABLE CHEST 1 VIEW COMPARISON:  Chest radiograph 11/08/2018 and earlier. FINDINGS: Portable AP semi upright view at 0356 hours. Lung volumes and mediastinal contours are stable since 2017, within normal limits. Allowing for portable technique the lungs are clear. No pneumothorax or pleural effusion. Chronic thoracic endplate spurring. No acute osseous abnormality identified. Paucity of bowel gas in the upper abdomen. IMPRESSION: Negative portable chest. Electronically Signed   By: Odessa Fleming M.D.   On: 10/23/2020 04:29    Procedures Procedures   Medications Ordered in ED Medications - No data to display  ED Course  I have reviewed the triage vital signs and the nursing notes.  Pertinent labs & imaging results that were available during my care of the patient were reviewed by me and considered in my medical decision making (see chart for details).    MDM Rules/Calculators/A&P                          55 yo M with a chief complaints of a productive cough.  Otherwise well-appearing and nontoxic.  On percent on room air.   Clear lung sounds for me.  Patient is endorsing some blood mixed with his sputum.  Could be coming from a nasal source though not obviously seen on exam.  Will obtain a chest x-ray without history.  I think unlikely to be PE.  Could be COVID will obtain a test.  CXR viewed by me without focal infiltrate.  D/c home.   4:35 AM:  I have discussed the diagnosis/risks/treatment options with the patient and believe the pt to be eligible for discharge home to follow-up with PCP. We also discussed returning to the ED immediately if new or worsening sx occur. We discussed the sx which are most concerning (e.g., sudden worsening pain, fever, inability to tolerate by mouth) that necessitate immediate return. Medications administered to the patient during their visit and any new prescriptions provided to the patient are listed below.  Medications given during this visit Medications - No data to display   The patient appears reasonably screen and/or stabilized for discharge and I doubt any other medical condition or other [facility] requiring further screening, evaluation, or treatment in the ED at this time prior to discharge.   Final Clinical Impression(s) / ED Diagnoses Final diagnoses:  Viral URI with cough    Rx / DC Orders ED Discharge Orders         Ordered    benzonatate (TESSALON) 100 MG capsule  Every 8 hours        10/23/20 0432           Melene Plan, DO 10/23/20 [phone]

## 2021-03-23 ENCOUNTER — Emergency Department (HOSPITAL_BASED_OUTPATIENT_CLINIC_OR_DEPARTMENT_OTHER)
Admission: EM | Admit: 2021-03-23 | Discharge: 2021-03-23 | Disposition: A | Discharge status: Home / Self Care | Payer: BC Managed Care – PPO | Attending: Emergency Medicine | Admitting: Emergency Medicine

## 2021-03-23 ENCOUNTER — Encounter (HOSPITAL_BASED_OUTPATIENT_CLINIC_OR_DEPARTMENT_OTHER): Payer: Self-pay | Admitting: *Deleted

## 2021-03-23 ENCOUNTER — Other Ambulatory Visit: Payer: Self-pay

## 2021-03-23 DIAGNOSIS — Z7982 Long term (current) use of aspirin: Secondary | ICD-10-CM | POA: Insufficient documentation

## 2021-03-23 DIAGNOSIS — R519 Headache, unspecified: Secondary | ICD-10-CM | POA: Diagnosis present

## 2021-03-23 DIAGNOSIS — E119 Type 2 diabetes mellitus without complications: Secondary | ICD-10-CM | POA: Insufficient documentation

## 2021-03-23 DIAGNOSIS — Z79899 Other long term (current) drug therapy: Secondary | ICD-10-CM | POA: Diagnosis not present

## 2021-03-23 DIAGNOSIS — G43909 Migraine, unspecified, not intractable, without status migrainosus: Secondary | ICD-10-CM | POA: Insufficient documentation

## 2021-03-23 DIAGNOSIS — Z7984 Long term (current) use of oral hypoglycemic drugs: Secondary | ICD-10-CM | POA: Diagnosis not present

## 2021-03-23 DIAGNOSIS — I10 Essential (primary) hypertension: Secondary | ICD-10-CM | POA: Insufficient documentation

## 2021-03-23 MED ORDER — SODIUM CHLORIDE 0.9 % IV BOLUS: 500.0000 mL | Freq: Once | INTRAVENOUS | Status: DC

## 2021-03-23 MED ORDER — PROCHLORPERAZINE EDISYLATE 10 MG/2ML IJ SOLN
10.0000 mg | Freq: Once | INTRAMUSCULAR | Status: DC
  Filled 2021-03-23: qty 2

## 2021-03-23 MED ORDER — KETOROLAC TROMETHAMINE 15 MG/ML IJ SOLN
15.0000 mg | Freq: Once | INTRAMUSCULAR | Status: DC
  Filled 2021-03-23: qty 1

## 2021-03-23 MED ORDER — KETOROLAC TROMETHAMINE 15 MG/ML IJ SOLN
15.0000 mg | Freq: Once | INTRAMUSCULAR | Status: AC
  Administered 2021-03-23: 15 mg via INTRAMUSCULAR

## 2021-03-23 MED ORDER — PROCHLORPERAZINE EDISYLATE 10 MG/2ML IJ SOLN
10.0000 mg | Freq: Once | INTRAMUSCULAR | Status: AC
  Administered 2021-03-23: 10 mg via INTRAMUSCULAR

## 2021-03-23 NOTE — ED Triage Notes (Signed)
Pt reports headache this morning, worsening throughout day. Hx of mha. Took tylenol around 1pm. States he is not sure if this is a regular mha or related to his BP

## 2021-03-23 NOTE — ED Provider Notes (Signed)
[facility] EMERGENCY DEPARTMENT Provider Note   CSN: [id number] Arrival date & time: 03/23/21  2045     History Chief Complaint  Patient presents with   Headache    Carlos Luse Sr. is a 55 y.o. male.  HPI Patient presents with headache.  Began this morning.  Worsening.  Has a history of migraines.  Sees neurology and is on some suppressing medications.  However is not had a headache like this for a while.  States it was bad enough that he was crying.  No relief with Tylenol.  Some nausea.  Some photophobia.  No trauma.  No fevers.  Did not begin acutely.  Similar previous migraines.  Is in the front of his head.    Past Medical History:  Diagnosis Date   Colon polyp    Diabetes mellitus without complication (HCC)    Hypercholesteremia    Hypertension    Kidney stone    Migraines    Prostatitis     Patient Active Problem List   Diagnosis Date Noted   Hypertension 05/17/2019   Dyspnea on exertion 11/05/2018   Obstructive sleep apnea 11/05/2018   Abnormal hemoglobin (Hgb) (HCC) 11/05/2018   Dehydration    Fever in adult    UTI (lower urinary tract infection) 10/29/2015   Migraine headache 10/29/2015   Fever 10/29/2015   Leukocytosis 10/29/2015   Hypokalemia 10/29/2015   Conjunctivitis 10/29/2015    Past Surgical History:  Procedure Laterality Date   CARDIAC CATHETERIZATION  1995, 2011   THYROIDECTOMY         Family History  Problem Relation Age of Onset   Brain cancer Mother    Diabetes Father    Stroke Father     Social History   Tobacco Use   Smoking status: Never   Smokeless tobacco: Never  Vaping Use   Vaping Use: Never used  Substance Use Topics   Alcohol use: Not Currently   Drug use: Never    Home Medications Prior to Admission medications   Medication Sig Start Date End Date Taking? Authorizing Provider  acetaminophen (TYLENOL) 325 MG tablet Take 650 mg by mouth every 6 (six) hours as needed for fever.    [provider]  amLODipine (NORVASC) 5 MG tablet Take 2 tablets (10 mg total) by mouth daily. 05/15/19   Terald Sleeper, MD  aspirin EC 81 MG tablet Take 81 mg by mouth daily.    [provider]  atorvastatin (LIPITOR) 20 MG tablet Take 20 mg by mouth daily. Reported on 10/29/2015 10/25/15   [provider]  benzonatate (TESSALON) 100 MG capsule Take 1 capsule (100 mg total) by mouth every 8 (eight) hours. 10/23/20   Melene Plan, DO  cloNIDine (CATAPRES) 0.1 MG tablet Take 0.1 mg by mouth 2 (two) times daily.    [provider]  diphenhydrAMINE (BENADRYL) 25 MG tablet Take 1 tablet (25 mg total) by mouth every 6 (six) hours as needed (Use for migraine, call PCP if no improvement after 2 tablets). 10/31/15   Rolly Salter, MD  hydrochlorothiazide (HYDRODIURIL) 25 MG tablet Take 37.5 mg by mouth daily. Patient taking 37.5 mg(tablet and a half) in the morning    [provider]  HYDROcodone-acetaminophen (NORCO/VICODIN) 5-325 MG tablet Take 1-2 tablets by mouth every 6 (six) hours as needed. 07/24/18   Bill Salinas, PA-C  metFORMIN (GLUCOPHAGE-XR) 500 MG 24 hr tablet Take 1,500 mg by mouth daily. 05/02/20   [provider]  ondansetron (ZOFRAN) 4 MG tablet Take 1 tablet (4 mg total) by mouth every 6 (six) hours. 02/05/18   Maczis, Elmer Sow, PA-C  potassium chloride (K-DUR) 10 MEQ tablet Take 10 mEq by mouth daily. with food 11/09/18   [provider]  potassium chloride SA (KLOR-CON) 20 MEQ tablet Take 2 tablets (40 mEq total) by mouth daily for 5 days. 06/29/20 07/04/20  Long, Arlyss Repress, MD  SUMAtriptan (IMITREX) 100 MG tablet Take 100 mg by mouth every 2 (two) hours as needed for migraine.  10/25/15   [provider]  TRULICITY 0.75 MG/0.5ML SOPN SMARTSIG:0.5 Milliliter(s) SUB-Q Once a Week 10/18/20   [provider]    Allergies    Amoxicillin, Benicar [olmesartan], and Sulfa antibiotics  Review of Systems   Review of Systems   Constitutional:  Positive for appetite change.  HENT:  Negative for congestion.   Eyes:  Positive for photophobia. Negative for visual disturbance.  Respiratory:  Negative for shortness of breath.   Cardiovascular:  Negative for chest pain.  Gastrointestinal:  Positive for nausea. Negative for vomiting.  Genitourinary:  Negative for flank pain.  Musculoskeletal:  Negative for back pain.  Skin:  Negative for rash.  Neurological:  Positive for headaches.   Physical Exam Updated Vital Signs BP (!) 145/93   Pulse (!) 54   Temp 98 F (36.7 C) (Oral)   Resp 18   Ht 5' 9.5" (1.765 m)   Wt 124.3 kg   SpO2 100%   BMI 39.88 kg/m   Physical Exam Vitals and nursing note reviewed.  HENT:     Head: Atraumatic.  Eyes:     General: No scleral icterus.    Pupils: Pupils are equal, round, and reactive to light.  Cardiovascular:     Rate and Rhythm: Regular rhythm.  Pulmonary:     Breath sounds: No wheezing or rhonchi.  Abdominal:     Tenderness: There is no abdominal tenderness.  Musculoskeletal:        General: No tenderness.     Cervical back: Neck supple.  Skin:    General: Skin is warm.     Capillary Refill: Capillary refill takes less than 2 seconds.  Neurological:     Mental Status: He is alert and oriented to person, place, and time.    ED Results / Procedures / Treatments   Labs (all labs ordered are listed, but only abnormal results are displayed) Labs Reviewed - No data to display  EKG None  Radiology No results found.  Procedures Procedures   Medications Ordered in ED Medications  ketorolac (TORADOL) 15 MG/ML injection 15 mg (15 mg Intramuscular Given 03/23/21 2227)  prochlorperazine (COMPAZINE) injection 10 mg (10 mg Intramuscular Given 03/23/21 2227)    ED Course  I have reviewed the triage vital signs and the nursing notes.  Pertinent labs & imaging results that were available during my care of the patient were reviewed by me and considered in my  medical decision making (see chart for details).    MDM Rules/Calculators/A&P                           Patient with headache.  Like previous migraines.  Frontal.  Treated and felt better.  Has had work-up previously and sees neurology.  Feels much better after treatment.  Did have a mild hypertension.  I think this is more likely reactive from the headache as opposed to the cause.  Does  have blood pressure medicines at home however.  Will discharge home with outpatient follow-up as needed Final Clinical Impression(s) / ED Diagnoses Final diagnoses:  Migraine without status migrainosus, not intractable, unspecified migraine type    Rx / DC Orders ED Discharge Orders     None        Benjiman Core, MD 03/23/21 2348

## 2021-03-23 NOTE — ED Notes (Signed)
Patient verbalizes understanding of discharge instructions. Opportunity for questioning and answers were provided. Armband removed by staff, pt discharged from ED. Ambulated out to lobby  

## 2021-03-25 ENCOUNTER — Encounter: Payer: BC Managed Care – PPO | Attending: Internal Medicine | Admitting: Registered"

## 2021-03-25 ENCOUNTER — Encounter: Payer: Self-pay | Admitting: Registered"

## 2021-03-25 ENCOUNTER — Other Ambulatory Visit: Payer: Self-pay

## 2021-03-25 DIAGNOSIS — E1165 Type 2 diabetes mellitus with hyperglycemia: Secondary | ICD-10-CM | POA: Insufficient documentation

## 2021-03-25 NOTE — Patient Instructions (Addendum)
Include your goals in your calendar to get alerts Set bedtime goal and create an alert at least 1 hour before to get in a bedtime routine. Exercise: start with 2-3 times per week and work up to 3-4 x/week cardio Idea for timing: 20 min at lunch and 30 min after work  Food: Aim to have vegetables daily When cooking large quantities put the left overs in the freezer. Try out a some of those [employer] recipes  Consider talking to your doctor about changing how you take metformin.  Checking blood sugar: Daily fasting blood with a goal of 80-130

## 2021-03-25 NOTE — Progress Notes (Signed)
Diabetes Self-Management Education  Visit Type: First/Initial  Appt. Start Time: 0800 Appt. End Time: 0930  03/25/2021  Mr. Jonn Markie, identified by name and date of birth, is a 55 y.o. male with a diagnosis of Diabetes: Type 2.   ASSESSMENT  There were no vitals taken for this visit. There is no height or weight on file to calculate BMI.  A1c 8.5% 12/25/20 Mediation: Metformin 1500 mg after dinner; Trulicity 1x/week  Pt reports side effects including sometimes stomach cramping during the night. Pt states sometimes he forget to take medication at night.  PMH: kidney stones, HTN, HDL 26, hypothyroidism  Pt states that he and his wife work from home but work long hours and tend to eat too late and not get as much exercise as they used to. Pt states he has a hard time giving up soda but is drinking the "zero" varieties.   Pt states through their insurance uses [employer] and provides SMBG supplies and tracks their blood sugar and gives access to resources such as GI recipe ideas.  Pt states dinners last week were:  Spaghetti with cheese garlic bread, no vegetable. Salad made with chicken, crab & shrimp, eggs, mayonnaise, with crackers or as a sandwich. Some meals were with green salad.  Pt states he would like to lose weight to feel better. Per MD notes goal is 272 lbs or less)   Diabetes Self-Management Education - 03/25/21 0819       Visit Information   Visit Type First/Initial      Initial Visit   Diabetes Type Type 2    Are you currently following a meal plan? No    Are you taking your medications as prescribed? Yes    Date Diagnosed 2017-2018      Health Coping   How would you rate your overall health? Fair      Psychosocial Assessment   Patient Belief/Attitude about Diabetes Other (comment)    How often do you need to have someone help you when you read instructions, pamphlets, or other written materials from your doctor or pharmacy? 1 - Never    What is the last  grade level you completed in school? BA degree      Complications   Last HgB A1C per patient/outside source 8.5 %   referral labs   How often do you check your blood sugar? --   at least 2-3 times week   Fasting Blood glucose range (mg/dL) 62-831   517 if forgets meds   Postprandial Blood glucose range (mg/dL) 616-073    Number of hypoglycemic episodes per month 0    Have you had a dilated eye exam in the past 12 months? Yes    Have you had a dental exam in the past 12 months? No    Are you checking your feet? Yes    How many days per week are you checking your feet? 7      Dietary Intake   Breakfast fruit, water    Lunch sandwich or Leftovers OR hibachi OR salads chick-fil-a full pack of dressing    Snack (afternoon) rarely    Dinner hot dot with bun, fries    Beverage(s) water ~40 oz, coke zero ~16 oz, regular gatorade when doing yard work      Exercise   Exercise Type ADL's   est. 25-30 min ADL stairs in hours     Patient Education   Previous Diabetes Education Yes (please comment)   2017-2018  Disease state  Definition of diabetes, type 1 and 2, and the diagnosis of diabetes    Nutrition management  Role of diet in the treatment of diabetes and the relationship between the three main macronutrients and blood glucose level;Food label reading, portion sizes and measuring food.;Carbohydrate counting    Physical activity and exercise  Role of exercise on diabetes management, blood pressure control and cardiac health.    Medications Reviewed patients medication for diabetes, action, purpose, timing of dose and side effects.    Monitoring Identified appropriate SMBG and/or A1C goals.;Purpose and frequency of SMBG.    Psychosocial adjustment Role of stress on diabetes   sleep     Individualized Goals (developed by patient)   Nutrition General guidelines for healthy choices and portions discussed    Physical Activity Exercise 3-5 times per week    Medications Other (comment)    strategy to take metformin to reduce side effects   Monitoring  test my blood glucose as discussed      Outcomes   Expected Outcomes Demonstrated interest in learning. Expect positive outcomes    Future DMSE 4-6 wks    Program Status Not Completed             Individualized Plan for Diabetes Self-Management Training:   Learning Objective:  Patient will have a greater understanding of diabetes self-management. Patient education plan is to attend individual and/or group sessions per assessed needs and concerns.  Patient Instructions  Include your goals in your calendar to get alerts Set bedtime goal and create an alert at least 1 hour before to get in a bedtime routine. Exercise: start with 2-3 times per week and work up to 3-4 x/week cardio Idea for timing: 20 min at lunch and 30 min after work  Food: Aim to have vegetables daily When cooking large quantities put the left overs in the freezer. Try out a some of those [employer] recipes  Consider talking to your doctor about changing how you take metformin.  Checking blood sugar: Daily fasting blood with a goal of 80-130  Expected Outcomes:  Demonstrated interest in learning. Expect positive outcomes  Education material provided: ADA - How to Thrive: A Guide for Your Journey with Diabetes, A1C conversion sheet, and Carbohydrate counting sheet  If problems or questions, patient to contact team via:  Phone and MyChart  Future DSME appointment: 4-6 wks

## 2021-05-06 ENCOUNTER — Ambulatory Visit: Payer: BC Managed Care – PPO | Admitting: Podiatry

## 2021-06-17 ENCOUNTER — Ambulatory Visit: Payer: BC Managed Care – PPO | Admitting: Registered"

## 2022-08-17 ENCOUNTER — Emergency Department (HOSPITAL_BASED_OUTPATIENT_CLINIC_OR_DEPARTMENT_OTHER): Payer: 59

## 2022-08-17 ENCOUNTER — Other Ambulatory Visit: Payer: Self-pay

## 2022-08-17 ENCOUNTER — Emergency Department (HOSPITAL_BASED_OUTPATIENT_CLINIC_OR_DEPARTMENT_OTHER)
Admission: EM | Admit: 2022-08-17 | Discharge: 2022-08-17 | Disposition: A | Discharge status: Home / Self Care | Payer: 59 | Attending: Emergency Medicine | Admitting: Emergency Medicine

## 2022-08-17 ENCOUNTER — Encounter (HOSPITAL_BASED_OUTPATIENT_CLINIC_OR_DEPARTMENT_OTHER): Payer: Self-pay | Admitting: Urology

## 2022-08-17 DIAGNOSIS — I10 Essential (primary) hypertension: Secondary | ICD-10-CM | POA: Insufficient documentation

## 2022-08-17 DIAGNOSIS — Z79899 Other long term (current) drug therapy: Secondary | ICD-10-CM | POA: Insufficient documentation

## 2022-08-17 DIAGNOSIS — E876 Hypokalemia: Secondary | ICD-10-CM | POA: Diagnosis not present

## 2022-08-17 DIAGNOSIS — Z7984 Long term (current) use of oral hypoglycemic drugs: Secondary | ICD-10-CM | POA: Insufficient documentation

## 2022-08-17 DIAGNOSIS — R519 Headache, unspecified: Secondary | ICD-10-CM | POA: Diagnosis not present

## 2022-08-17 DIAGNOSIS — E119 Type 2 diabetes mellitus without complications: Secondary | ICD-10-CM | POA: Insufficient documentation

## 2022-08-17 DIAGNOSIS — Z7982 Long term (current) use of aspirin: Secondary | ICD-10-CM | POA: Insufficient documentation

## 2022-08-17 DIAGNOSIS — R0789 Other chest pain: Secondary | ICD-10-CM | POA: Insufficient documentation

## 2022-08-17 DIAGNOSIS — R079 Chest pain, unspecified: Secondary | ICD-10-CM

## 2022-08-17 LAB — BASIC METABOLIC PANEL
Anion gap: 12 (ref 5–15)
BUN: 14 mg/dL (ref 6–20)
CO2: 31 mmol/L (ref 22–32)
Calcium: 9.5 mg/dL (ref 8.9–10.3)
Chloride: 94 mmol/L — ABNORMAL LOW (ref 98–111)
Creatinine, Ser: 1.32 mg/dL — ABNORMAL HIGH (ref 0.61–1.24)
GFR, Estimated: >60 mL/min (ref >60)
Glucose, Bld: 150 mg/dL — ABNORMAL HIGH (ref 70–99)
Potassium: 2.4 mmol/L — CL (ref 3.5–5.1)
Sodium: 137 mmol/L (ref 135–145)

## 2022-08-17 LAB — CBC
HCT: 44.2 % (ref 39.0–52.0)
Hemoglobin: 14.8 g/dL (ref 13.0–17.0)
MCH: 26.9 pg (ref 26.0–34.0)
MCHC: 33.5 g/dL (ref 30.0–36.0)
MCV: 80.2 fL (ref 80.0–100.0)
Platelets: 227 10*3/uL (ref 150–400)
RBC: 5.51 MIL/uL (ref 4.22–5.81)
RDW: 14.5 % (ref 11.5–15.5)
WBC: 9.9 10*3/uL (ref 4.0–10.5)
nRBC: 0 % (ref 0.0–0.2)

## 2022-08-17 LAB — TROPONIN I (HIGH SENSITIVITY)
Troponin I (High Sensitivity): 8 ng/L (ref <18)
Troponin I (High Sensitivity): 9 ng/L (ref <18)

## 2022-08-17 LAB — MAGNESIUM: Magnesium: 1.9 mg/dL (ref 1.7–2.4)

## 2022-08-17 MED ORDER — POTASSIUM CHLORIDE CRYS ER 20 MEQ PO TBCR: 20.0000 meq | EXTENDED_RELEASE_TABLET | Freq: Two times a day (BID) | ORAL | 0 refills | Status: AC

## 2022-08-17 MED ORDER — METOCLOPRAMIDE HCL 5 MG/ML IJ SOLN
10.0000 mg | Freq: Once | INTRAMUSCULAR | Status: AC
  Administered 2022-08-17: 10 mg via INTRAVENOUS
  Filled 2022-08-17: qty 2

## 2022-08-17 MED ORDER — KETOROLAC TROMETHAMINE 30 MG/ML IJ SOLN
30.0000 mg | Freq: Once | INTRAMUSCULAR | Status: AC
  Administered 2022-08-17: 30 mg via INTRAVENOUS
  Filled 2022-08-17: qty 1

## 2022-08-17 MED ORDER — POTASSIUM CHLORIDE CRYS ER 20 MEQ PO TBCR
40.0000 meq | EXTENDED_RELEASE_TABLET | Freq: Once | ORAL | Status: AC
  Administered 2022-08-17: 40 meq via ORAL
  Filled 2022-08-17: qty 2

## 2022-08-17 MED ORDER — DIPHENHYDRAMINE HCL 50 MG/ML IJ SOLN
12.5000 mg | Freq: Once | INTRAMUSCULAR | Status: AC
  Administered 2022-08-17: 12.5 mg via INTRAVENOUS
  Filled 2022-08-17: qty 1

## 2022-08-17 MED ORDER — POTASSIUM CHLORIDE 10 MEQ/100ML IV SOLN
10.0000 meq | INTRAVENOUS | Status: AC
  Administered 2022-08-17 (×2): 10 meq via INTRAVENOUS
  Filled 2022-08-17: qty 100

## 2022-08-17 NOTE — ED Triage Notes (Signed)
Pt states woke up with headache and started to have left sided chest pain and pressure approx 2 hrs pta  Mild SOB, Denies N/V  H/o inverted T waves. And cardiac cath due to EKG abnormalities  H/o migraines as well

## 2022-08-17 NOTE — Discharge Instructions (Addendum)
Please reach out to your primary care doctor's office for a follow-up appointment.  You need to have your potassium levels rechecked this week.  Your potassium levels were 2.4 which is low today.  This may be a side effect of your hydrochlorothiazide diuretic medication for blood pressure.  You should discuss this with your primary care office, as they may wish to switch you to a different blood pressure medicine.  You should also discuss your chest pain with your primary care doctor's office.

## 2022-08-17 NOTE — ED Notes (Signed)
Pt states increased stress due to being laid off from job

## 2022-08-17 NOTE — ED Notes (Signed)
Pt c/o HA; denies CP at this time

## 2022-08-17 NOTE — ED Provider Notes (Addendum)
[facility] Provider Note   CSN: [id number] Arrival date & time: 08/17/22  1752     History  Chief Complaint  Patient presents with   Chest Pain    Carlos Garfinkle Sr. is a 57 y.o. male with history of hypertension, diabetes, presenting to the ED with complaint of chest pressure and headache.  The patient reports he suffers from chronic migraines, has 4-5 migraines a month, and is on maintenance medications, and woke up with a headache this morning.  At first it was bitemporal, but has now extended down the backside of his neck bilaterally.  He denies blurred vision.  He reports that he was shopping with his wife around 4:00 this afternoon he began having pressure in the left side of his chest under his left breast.  It feels like a pressure sensation.  He says it feels different than prior chest pain episodes he has had.  He reports he has had 3 coronary catheterizations in his lifetime and told that "he has no blockages around his heart".  1 of these was in [place].  He reports 2 others were performed at [facility].  HPI     Home Medications Prior to Admission medications   Medication Sig Start Date End Date Taking? Authorizing Provider  potassium chloride SA (KLOR-CON M) 20 MEQ tablet Take 1 tablet (20 mEq total) by mouth 2 (two) times daily for 10 days. 08/17/22 08/27/22 Yes Arasely Akkerman, Carola Rhine, MD  acetaminophen (TYLENOL) 325 MG tablet Take 650 mg by mouth every 6 (six) hours as needed for fever.    [provider]  amLODipine (NORVASC) 5 MG tablet Take 2 tablets (10 mg total) by mouth daily. 05/15/19   Wyvonnia Dusky, MD  aspirin EC 81 MG tablet Take 81 mg by mouth daily.    [provider]  atorvastatin (LIPITOR) 20 MG tablet Take 20 mg by mouth daily. Reported on 10/29/2015 10/25/15   [provider]  benzonatate (TESSALON) 100 MG capsule Take 1 capsule (100 mg total) by mouth every 8  (eight) hours. 10/23/20   Deno Etienne, DO  cloNIDine (CATAPRES) 0.1 MG tablet Take 0.1 mg by mouth 2 (two) times daily.    [provider]  diphenhydrAMINE (BENADRYL) 25 MG tablet Take 1 tablet (25 mg total) by mouth every 6 (six) hours as needed (Use for migraine, call PCP if no improvement after 2 tablets). 10/31/15   Lavina Hamman, MD  hydrochlorothiazide (HYDRODIURIL) 25 MG tablet Take 37.5 mg by mouth daily. Patient taking 37.5 mg(tablet and a half) in the morning    [provider]  HYDROcodone-acetaminophen (NORCO/VICODIN) 5-325 MG tablet Take 1-2 tablets by mouth every 6 (six) hours as needed. 07/24/18   Deliah Boston, PA-C  metFORMIN (GLUCOPHAGE-XR) 500 MG 24 hr tablet Take 1,500 mg by mouth daily. 05/02/20   [provider]  omeprazole (PRILOSEC) 40 MG capsule Take 40 mg by mouth 2 (two) times daily.    [provider]  ondansetron (ZOFRAN) 4 MG tablet Take 1 tablet (4 mg total) by mouth every 6 (six) hours. 02/05/18   Maczis, Barth Kirks, PA-C  potassium chloride (K-DUR) 10 MEQ tablet Take 10 mEq by mouth daily. with food 11/09/18   [provider]  potassium chloride SA (KLOR-CON) 20 MEQ tablet Take 2 tablets (40 mEq total) by mouth daily for 5 days. 06/29/20 07/04/20  Margette Fast, MD  Rimegepant Sulfate (NURTEC PO)  Take by mouth.    [provider]  SUMAtriptan (IMITREX) 100 MG tablet Take 100 mg by mouth every 2 (two) hours as needed for migraine.  10/25/15   [provider]  TRULICITY [id number] SOPN SMARTSIG:0.5 Milliliter(s) SUB-Q Once a Week 10/18/20   [provider]      Allergies    Amoxicillin, Benicar [olmesartan], and Sulfa antibiotics    Review of Systems   Review of Systems  Physical Exam Updated Vital Signs BP 130/82   Pulse 62   Temp 98.4 F (36.9 C) (Oral)   Resp 11   Ht 5\' 10"  (1.778 m)   Wt 124.3 kg   SpO2 100%   BMI 39.32 kg/m  Physical Exam Constitutional:      General: He is not in  acute distress. HENT:     Head: Normocephalic and atraumatic.  Eyes:     Conjunctiva/sclera: Conjunctivae normal.     Pupils: Pupils are equal, round, and reactive to light.  Cardiovascular:     Rate and Rhythm: Normal rate and regular rhythm.  Pulmonary:     Effort: Pulmonary effort is normal. No respiratory distress.  Abdominal:     General: There is no distension.     Tenderness: There is no abdominal tenderness.  Skin:    General: Skin is warm and dry.  Neurological:     General: No focal deficit present.     Mental Status: He is alert and oriented to person, place, and time. Mental status is at baseline.     Cranial Nerves: No cranial nerve deficit.  Psychiatric:        Mood and Affect: Mood normal.        Behavior: Behavior normal.     ED Results / Procedures / Treatments   Labs (all labs ordered are listed, but only abnormal results are displayed) Labs Reviewed  BASIC METABOLIC PANEL - Abnormal; Notable for the following components:      Result Value   Potassium 2.4 (*)    Chloride 94 (*)    Glucose, Bld 150 (*)    Creatinine, Ser 1.32 (*)    All other components within normal limits  CBC  MAGNESIUM  TROPONIN I (HIGH SENSITIVITY)  TROPONIN I (HIGH SENSITIVITY)    EKG None  Radiology DG Chest 2 View  Result Date: 08/17/2022 CLINICAL DATA:  Chest pain, awoke with headache and LEFT side chest pain/pressure 2 hours ago, mild shortness of breath EXAM: CHEST - 2 VIEW COMPARISON:  By 06/24/2020 FINDINGS: Normal heart size, mediastinal contours, and pulmonary vascularity. Lungs clear. No pleural effusion or pneumothorax. Bones mild endplate spur formation thoracic spine. IMPRESSION: No acute abnormalities Electronically Signed   By: Lavonia Dana M.D.   On: 08/17/2022 18:36    Procedures .Critical Care  Performed by: Wyvonnia Dusky, MD Authorized by: Wyvonnia Dusky, MD   Critical care provider statement:    Critical care time (minutes):  30   Critical care  time was exclusive of:  Separately billable procedures and treating other patients   Critical care was necessary to treat or prevent imminent or life-threatening deterioration of the following conditions:  Metabolic crisis   Critical care was time spent personally by me on the following activities:  Ordering and performing treatments and interventions, ordering and review of laboratory studies, ordering and review of radiographic studies, pulse oximetry, review of old charts, examination of patient and evaluation of patient's response to treatment Comments:  Hypokalemia management     Medications Ordered in ED Medications  ketorolac (TORADOL) 30 MG/ML injection 30 mg (30 mg Intravenous Given 08/17/22 1833)  metoCLOPramide (REGLAN) injection 10 mg (10 mg Intravenous Given 08/17/22 1835)  diphenhydrAMINE (BENADRYL) injection 12.5 mg (12.5 mg Intravenous Given 08/17/22 1833)  potassium chloride 10 mEq in 100 mL IVPB (10 mEq Intravenous New Bag/Given 08/17/22 2022)  potassium chloride SA (KLOR-CON M) CR tablet 40 mEq (40 mEq Oral Given 08/17/22 1909)    ED Course/ Medical Decision Making/ A&P Clinical Course as of 08/17/22 2154  Sun Aug 17, 2022  2041 Delta troponins were negative.  Patient reports complete resolution of his headache.  We are completing IV potassium and I would anticipate discharge home afterwards.  Patient will need follow-up and repeat check of his potassium level.  I advised that he contact his PCPs office tomorrow, as the hydrochlorothiazide may be contributing to his hypokalemia, but I would prefer to have his primary physician make alterations to a longstanding BP medications. [MT]    Clinical Course User Index [MT] Mayli Covington, Carola Rhine, MD                             Medical Decision Making Amount and/or Complexity of Data Reviewed Labs: ordered. Radiology: ordered.  Risk Prescription drug management.   This patient presents to the ED with concern for posterior  headache, chest discomfort. This involves an extensive number of treatment options, and is a complaint that carries with it a high risk of complications and morbidity.  The differential diagnosis includes occipital headache versus migraine headache versus tension type headache versus other  Chest pain may be nonspecific but include reflux gastritis versus atypical ACS versus pneumonia versus other   External records from outside source obtained and reviewed including chronic hypokalemia issues on prior potassium checks, has not been checked in about 2 years  I ordered and personally interpreted labs.  The pertinent results include: Hypokalemia, no other emergent findings.  Delta troponin is negative  I ordered imaging studies including x-ray of the chest I independently visualized and interpreted imaging which showed no emergent findings I agree with the radiologist interpretation  The patient was maintained on a cardiac monitor.  I personally viewed and interpreted the cardiac monitored which showed an underlying rhythm of: Sinus rhythm  Per my interpretation the patient's ECG shows sinus rhythm with chronic inverted T waves in the inferior lateral leads, unchanged from prior imaging  I ordered medication including oral and IV potassium for hypokalemia.  IV migraine medications  I have reviewed the patients home medicines and have made adjustments as needed  Test Considered: I will lower suspicion for acute PE and do not see an indication for CT angiogram.  Likewise, I have a low suspicion for meningitis or intracranial hemorrhage I do not see an indication for MRI or lumbar puncture at this time.   After the interventions noted above, I reevaluated the patient and found that they have: improved  Patient was asymptomatic on reassessment  Dispostion:  After consideration of the diagnostic results and the patients response to treatment, I feel that the patent would benefit from close  outpatient follow-up.  The patient reports hypokalemia is a chronic issue.  He will discuss with his PCP HCTZ medication continuation.  The meantime I will start him on supplements.  Given that he is not having any arrhythmia issues on his telemetry monitoring, and this is  likely a chronic ongoing issue, I do think it is reasonable for him to manage as an outpatient and start oral potassium supplements.         Final Clinical Impression(s) / ED Diagnoses Final diagnoses:  Chest pain, unspecified type  Hypokalemia  Nonintractable headache, unspecified chronicity pattern, unspecified headache type    Rx / DC Orders ED Discharge Orders          Ordered    potassium chloride SA (KLOR-CON M) 20 MEQ tablet  2 times daily        08/17/22 2108              Wyvonnia Dusky, MD 08/17/22 2156    Wyvonnia Dusky, MD 08/17/22 2157

## 2022-10-13 ENCOUNTER — Other Ambulatory Visit (HOSPITAL_BASED_OUTPATIENT_CLINIC_OR_DEPARTMENT_OTHER): Payer: Self-pay

## 2022-10-13 MED ORDER — MOUNJARO 5 MG/0.5ML ~~LOC~~ SOAJ
5.0000 mg | SUBCUTANEOUS | 4 refills | Status: DC
  Filled 2022-10-13: qty 2, 28d supply, fill #0

## 2022-11-07 ENCOUNTER — Other Ambulatory Visit (HOSPITAL_BASED_OUTPATIENT_CLINIC_OR_DEPARTMENT_OTHER): Payer: Self-pay

## 2022-11-10 ENCOUNTER — Other Ambulatory Visit (HOSPITAL_BASED_OUTPATIENT_CLINIC_OR_DEPARTMENT_OTHER): Payer: Self-pay

## 2022-11-10 MED ORDER — MOUNJARO 7.5 MG/0.5ML ~~LOC~~ SOAJ
7.5000 mg | SUBCUTANEOUS | 3 refills | Status: DC
  Filled 2022-11-10: qty 2, 28d supply, fill #0
  Filled 2022-12-05: qty 2, 28d supply, fill #1

## 2022-11-12 ENCOUNTER — Other Ambulatory Visit (HOSPITAL_BASED_OUTPATIENT_CLINIC_OR_DEPARTMENT_OTHER): Payer: Self-pay

## 2022-12-05 ENCOUNTER — Other Ambulatory Visit (HOSPITAL_BASED_OUTPATIENT_CLINIC_OR_DEPARTMENT_OTHER): Payer: Self-pay

## 2022-12-12 ENCOUNTER — Other Ambulatory Visit (HOSPITAL_BASED_OUTPATIENT_CLINIC_OR_DEPARTMENT_OTHER): Payer: Self-pay

## 2023-01-14 ENCOUNTER — Other Ambulatory Visit (HOSPITAL_BASED_OUTPATIENT_CLINIC_OR_DEPARTMENT_OTHER): Payer: Self-pay

## 2023-01-14 MED ORDER — MOUNJARO 10 MG/0.5ML ~~LOC~~ SOAJ
10.0000 mg | SUBCUTANEOUS | 0 refills | Status: DC
  Filled 2023-01-14: qty 2, 28d supply, fill #0

## 2023-02-05 ENCOUNTER — Other Ambulatory Visit: Payer: Self-pay | Admitting: Urology

## 2023-02-05 ENCOUNTER — Other Ambulatory Visit: Payer: Self-pay

## 2023-02-05 DIAGNOSIS — R310 Gross hematuria: Secondary | ICD-10-CM

## 2023-02-06 ENCOUNTER — Other Ambulatory Visit: Payer: Self-pay | Admitting: Urology

## 2023-02-06 ENCOUNTER — Other Ambulatory Visit (HOSPITAL_BASED_OUTPATIENT_CLINIC_OR_DEPARTMENT_OTHER): Payer: Self-pay

## 2023-02-06 DIAGNOSIS — N3020 Other chronic cystitis without hematuria: Secondary | ICD-10-CM

## 2023-02-06 MED ORDER — MOUNJARO 10 MG/0.5ML ~~LOC~~ SOAJ
10.0000 mg | SUBCUTANEOUS | 3 refills | Status: DC
  Filled 2023-02-06: qty 2, 28d supply, fill #0
  Filled 2023-03-16: qty 2, 28d supply, fill #1

## 2023-02-09 ENCOUNTER — Other Ambulatory Visit (HOSPITAL_BASED_OUTPATIENT_CLINIC_OR_DEPARTMENT_OTHER): Payer: Self-pay

## 2023-02-11 ENCOUNTER — Other Ambulatory Visit (HOSPITAL_BASED_OUTPATIENT_CLINIC_OR_DEPARTMENT_OTHER): Payer: Self-pay

## 2023-02-12 ENCOUNTER — Other Ambulatory Visit (HOSPITAL_BASED_OUTPATIENT_CLINIC_OR_DEPARTMENT_OTHER): Payer: Self-pay

## 2023-02-23 ENCOUNTER — Other Ambulatory Visit (HOSPITAL_BASED_OUTPATIENT_CLINIC_OR_DEPARTMENT_OTHER): Payer: Self-pay

## 2023-02-23 MED ORDER — MOUNJARO 10 MG/0.5ML ~~LOC~~ SOAJ
10.0000 mg | SUBCUTANEOUS | 4 refills | Status: DC
  Filled 2023-03-02: qty 6, 84d supply, fill #0
  Filled 2023-03-16: qty 2, 28d supply, fill #0

## 2023-03-02 ENCOUNTER — Other Ambulatory Visit (HOSPITAL_BASED_OUTPATIENT_CLINIC_OR_DEPARTMENT_OTHER): Payer: Self-pay

## 2023-03-10 ENCOUNTER — Ambulatory Visit (INDEPENDENT_AMBULATORY_CARE_PROVIDER_SITE_OTHER): Payer: 59

## 2023-03-10 ENCOUNTER — Ambulatory Visit: Payer: 59

## 2023-03-10 ENCOUNTER — Encounter: Payer: Self-pay | Admitting: Podiatry

## 2023-03-10 ENCOUNTER — Ambulatory Visit: Payer: 59 | Admitting: Podiatry

## 2023-03-10 VITALS — Ht 70.0 in | Wt 274.0 lb

## 2023-03-10 DIAGNOSIS — M778 Other enthesopathies, not elsewhere classified: Secondary | ICD-10-CM

## 2023-03-10 DIAGNOSIS — M21611 Bunion of right foot: Secondary | ICD-10-CM

## 2023-03-10 DIAGNOSIS — M7742 Metatarsalgia, left foot: Secondary | ICD-10-CM

## 2023-03-10 DIAGNOSIS — M19072 Primary osteoarthritis, left ankle and foot: Secondary | ICD-10-CM | POA: Diagnosis not present

## 2023-03-10 DIAGNOSIS — M19271 Secondary osteoarthritis, right ankle and foot: Secondary | ICD-10-CM

## 2023-03-10 DIAGNOSIS — M2011 Hallux valgus (acquired), right foot: Secondary | ICD-10-CM

## 2023-03-10 DIAGNOSIS — M7741 Metatarsalgia, right foot: Secondary | ICD-10-CM

## 2023-03-10 DIAGNOSIS — B351 Tinea unguium: Secondary | ICD-10-CM

## 2023-03-10 DIAGNOSIS — M2141 Flat foot [pes planus] (acquired), right foot: Secondary | ICD-10-CM

## 2023-03-10 DIAGNOSIS — M2142 Flat foot [pes planus] (acquired), left foot: Secondary | ICD-10-CM

## 2023-03-10 MED ORDER — TERBINAFINE HCL 250 MG PO TABS: 250.0000 mg | ORAL_TABLET | Freq: Every day | ORAL | 0 refills | Status: AC

## 2023-03-10 NOTE — Patient Instructions (Signed)
VISIT SUMMARY:  During your visit, we discussed your ongoing foot pain and discomfort, as well as your persistent toenail fungus. We also reviewed your diabetes and high cholesterol, both of which are well-managed with your current medications. We have outlined a plan to address each of these issues, with the goal of improving your comfort and overall health.  YOUR PLAN:  -TOENAIL FUNGUS: You have a persistent fungal infection in your toenails. We will start you on an oral medication called Lamisil, which can help clear the infection. We will check your liver function in mid-November before starting this medication. If this doesn't work or causes side effects, we may consider laser treatment.  -FOOT PAIN: Your foot pain is likely due to flat feet and arthritis. We will order custom orthotics, which are special shoe inserts designed to provide arch support and reduce pain. We may also consider cortisone injections to reduce inflammation, but we need to be careful because these can affect your blood sugar levels. If these measures don't help, we may consider other options like a custom ankle brace or surgery.     INSTRUCTIONS:  We will schedule an appointment for you to get fitted for custom orthotics. Please continue to take your medications as prescribed. You should have a check-up once a year, or come back sooner if you have any problems or concerns.

## 2023-03-10 NOTE — Progress Notes (Signed)
  Subjective:  Patient ID: Carlos Blend Sr., male    DOB: August 15, 1965,  MRN: [id number]  Chief Complaint  Patient presents with   Diabetes    New patient for [facility] and bilateral foot and ankle pain    Discussed the use of AI scribe software for clinical note transcription with the patient, who gave verbal consent to proceed.  History of Present Illness   The patient, with a past medical history of diabetes, hyperlipidemia, and thyroidectomy, presents with chronic foot pain and discomfort. He reports having flat feet and experiencing burning sensations and muscle fatigue, particularly when walking. The patient also mentions having to wear shoes even when indoors due to the discomfort. He has tried over-the-counter treatments for a persistent toenail fungus, but without success. 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 He is currently on medication for diabetes and cholesterol, and has been managing these conditions for several years.          Objective:    Physical Exam   MUSCULOSKELETAL: Ankle joint on the right moves well. Big toe joint moves well. Presence of a bunion on the left foot. Pain and tenderness along the flat portion of the medial longitudinal arch. Hallux valgus formed bilaterally. Onychomycosis of the right hallux nail. Pain in the midtarsal and subtalar joints with some limited range of motion on the left ankle.       No images are attached to the encounter.    Results   RADIOLOGY Left ankle and foot x-ray: Old avulsion injuries medial malleolus, bone spurring dorsal TN and CCJ, TN and CC arthritis (03/10/2023) Right foot x-ray: Pes planus, bunion, mild midtarsal arthritis (03/10/2023)      Assessment:   1. Osteoarthritis of left ankle and foot   2. Onychomycosis   3. Other secondary osteoarthritis of right foot   4. Hallux valgus with bunions, right   5. Pes planus  of both feet      Plan:  Patient was evaluated and treated and all questions answered.  Assessment and Plan    Onychomycosis   He has a chronic fungal infection of the toenails that has not responded to over-the-counter treatments. We will start oral Lamisil, pending liver function tests, which we will check in mid-November by his PCP with his routine lab work, he will have a copy sent to me. If oral medication is ineffective or causes side effects, we will consider laser treatment.  Foot Pain   He experiences pain and discomfort in both feet, exacerbated by flat feet and arthritis. We will order custom orthotics to provide arch support and reduce pain. Cortisone injections for inflammation are under consideration, mindful of the potential impact on blood sugar levels. If conservative measures are ineffective, we will consider long-term options such as a custom ankle brace or fusion surgery.    Follow-up   We will schedule an appointment with an orthotist for orthotics fitting. He should have an annual follow-up or return sooner if any issues arise.          Return in about 1 year (around 03/09/2024) for diabetic foot exam .

## 2023-03-16 ENCOUNTER — Other Ambulatory Visit (HOSPITAL_BASED_OUTPATIENT_CLINIC_OR_DEPARTMENT_OTHER): Payer: Self-pay

## 2023-03-25 ENCOUNTER — Ambulatory Visit: Payer: 59

## 2023-03-25 ENCOUNTER — Ambulatory Visit
Admission: RE | Admit: 2023-03-25 | Discharge: 2023-03-25 | Disposition: A | Discharge status: Home / Self Care | Payer: 59 | Source: Ambulatory Visit | Attending: Urology

## 2023-03-25 DIAGNOSIS — N3020 Other chronic cystitis without hematuria: Secondary | ICD-10-CM

## 2023-03-26 ENCOUNTER — Ambulatory Visit (INDEPENDENT_AMBULATORY_CARE_PROVIDER_SITE_OTHER): Payer: 59

## 2023-03-26 DIAGNOSIS — M7741 Metatarsalgia, right foot: Secondary | ICD-10-CM

## 2023-03-26 DIAGNOSIS — M7742 Metatarsalgia, left foot: Secondary | ICD-10-CM

## 2023-03-26 DIAGNOSIS — M2011 Hallux valgus (acquired), right foot: Secondary | ICD-10-CM

## 2023-03-26 DIAGNOSIS — M21611 Bunion of right foot: Secondary | ICD-10-CM | POA: Diagnosis not present

## 2023-03-26 NOTE — Progress Notes (Signed)
Orthotics   Patient was present and evaluated for Custom molded foot orthotics. Patient will benefit from CFO's to provide total contact to BIL MLA's helping to balance and distribute body weight more evenly across BIL feet helping to reduce plantar pressure and pain. Orthotic will also encourage FF / RF alignment  Patient was scanned today and will return for fitting upon receipt    Carlos Greene Cped, CFo, CFm WILL HOLD FOR PATIENT TO CHECK INS

## 2023-03-31 ENCOUNTER — Telehealth: Payer: Self-pay | Admitting: Podiatry

## 2023-03-31 ENCOUNTER — Other Ambulatory Visit: Payer: Self-pay | Admitting: Urology

## 2023-03-31 DIAGNOSIS — R310 Gross hematuria: Secondary | ICD-10-CM

## 2023-03-31 DIAGNOSIS — N3020 Other chronic cystitis without hematuria: Secondary | ICD-10-CM

## 2023-03-31 NOTE — Telephone Encounter (Signed)
Pt called and has verified with his insurance that they would cover the orthotics) L3020) and is wanting to proceed. It shows that you have them on hold until you heard back from him. I told him it could take 4 to 6 weeks to get them back and someone would call when they are back in to schedule an appt to pick them up.

## 2023-04-09 ENCOUNTER — Ambulatory Visit
Admission: RE | Admit: 2023-04-09 | Discharge: 2023-04-09 | Disposition: A | Discharge status: Home / Self Care | Payer: 59 | Source: Ambulatory Visit | Attending: Urology | Admitting: Urology

## 2023-04-09 DIAGNOSIS — R310 Gross hematuria: Secondary | ICD-10-CM

## 2023-04-09 MED ORDER — IOPAMIDOL (ISOVUE-300) INJECTION 61%
125.0000 mL | Freq: Once | INTRAVENOUS | Status: AC | PRN
  Administered 2023-04-09: 125 mL via INTRAVENOUS

## 2023-04-13 ENCOUNTER — Other Ambulatory Visit (HOSPITAL_BASED_OUTPATIENT_CLINIC_OR_DEPARTMENT_OTHER): Payer: Self-pay

## 2023-04-14 ENCOUNTER — Other Ambulatory Visit (HOSPITAL_BASED_OUTPATIENT_CLINIC_OR_DEPARTMENT_OTHER): Payer: Self-pay

## 2023-05-26 ENCOUNTER — Ambulatory Visit
Admission: RE | Admit: 2023-05-26 | Discharge: 2023-05-26 | Disposition: A | Discharge status: Home / Self Care | Payer: 59 | Source: Ambulatory Visit | Attending: Urology

## 2023-05-26 MED ORDER — GADOPICLENOL 0.5 MMOL/ML IV SOLN
10.0000 mL | Freq: Once | INTRAVENOUS | Status: AC | PRN
  Administered 2023-05-26: 10 mL via INTRAVENOUS

## 2023-05-27 DIAGNOSIS — Z9889 Other specified postprocedural states: Secondary | ICD-10-CM

## 2023-05-27 HISTORY — DX: Other specified postprocedural states: Z98.890

## 2023-06-26 ENCOUNTER — Ambulatory Visit: Payer: BC Managed Care – PPO | Attending: Cardiology | Admitting: Cardiology

## 2023-06-26 VITALS — BP 100/70 | HR 58 | Ht 69.5 in | Wt 249.0 lb

## 2023-06-26 DIAGNOSIS — E119 Type 2 diabetes mellitus without complications: Secondary | ICD-10-CM | POA: Diagnosis not present

## 2023-06-26 DIAGNOSIS — Z01810 Encounter for preprocedural cardiovascular examination: Secondary | ICD-10-CM | POA: Insufficient documentation

## 2023-06-26 DIAGNOSIS — R9431 Abnormal electrocardiogram [ECG] [EKG]: Secondary | ICD-10-CM | POA: Insufficient documentation

## 2023-06-26 DIAGNOSIS — Z136 Encounter for screening for cardiovascular disorders: Secondary | ICD-10-CM

## 2023-06-26 DIAGNOSIS — E785 Hyperlipidemia, unspecified: Secondary | ICD-10-CM

## 2023-06-26 DIAGNOSIS — I10 Essential (primary) hypertension: Secondary | ICD-10-CM | POA: Diagnosis not present

## 2023-06-26 DIAGNOSIS — E1169 Type 2 diabetes mellitus with other specified complication: Secondary | ICD-10-CM | POA: Insufficient documentation

## 2023-06-26 NOTE — Progress Notes (Unsigned)
Cardiology Office Note:  .   Date:  06/27/2023  ID:  Carlos Blend SR., DOB 03/20/1966, MRN [id number] PCP: Merri Brunette, MD  [facility] Providers Cardiologist:  Bryan Lemma, MD     Chief Complaint  Patient presents with   New Patient (Initial Visit)    Preop assessment; abnormal EKG   Patient Profile: .     Carlos [employer]. is an obese 58 y.o. male non-smoker with a PMH notable for DM-2, hypothyroidism (s/p thyroidectomy) who presents here for Preop Assessment at the request of Lysle Rubens, MD. He is accompanied by his wife  By report, he has had 3 catheterizations in the past due to inverted T waves on EKG.  Was told he had no stenoses and has not had PCI.    Carlos Primus SR. was recently seen by Dr. Azucena Cecil [facility]) on January 21 for evaluation of thickened appendix with plans for up appendectomy.  Because he had previously been seen by cardiology for abnormal EKG, he is referred back to cardiology for assessment prior to planning surgery. -Apparently the initial evaluation started with screening evaluation for prostate cancer with an MRI that demonstrated a thickened appendix suggestive of either chronic meningitis, appendiceal mucocele or appendiceal tumor.  ER evaluation 08/17/2022 for chest pain.  Ruled out with negative troponins.  Treated for musculoskeletal pain  Subjective  Carlos Primus SR. presents here today with his wife for preop assessment for potential upcoming appendectomy and even potentially prostate surgery.  He is a little bit confused because he has not had any cardiac issues for quite some time but did note having had extensive evaluation of an abnormal EKG in the past.  He was told something about T waves.  He had 2 heart catheterizations done while he was living in [place] and he had again another 1 while living in the [place] area.  Unfortunately were not able to get the cath report from the most recent procedure at [facility] through the Care Everywhere system.  He indicates that he is relatively active although has been a little sedentary over the recent cold spell.  He is also limited by some arthritis pains in his left knee and ankle.  Prior to this setback, he was pretty active and denied any rest or exertional chest pain or pressure.  Now that he has been sedentary for a while, he does have some deconditioning related exertional dyspnea but no worrisome symptoms.  He denies any PND, orthopnea or edema (except for some swelling of the joints).  No rapid irregular heartbeats or palpitations.  No syncope/near syncope or TIA/amaurosis fugax.  No claudication.  We reviewed his EKGs and discussed the current EKG which in the absence of any other information would be somewhat concerning however he is completely asymptomatic and has had unusual ST segments in the precordial leads in the past with negative evaluation.  We reviewed his medications and he mentioned pretty significant angioedema reaction to Benicar in the past and therefore has not been on ARB ACE inhibitor or Arni.  Cardiovascular ROS: no chest pain or dyspnea on exertion positive for - a little deconditioned with some mild exercise intolerance and dyspnea.  Trivial swelling mostly related to arthritis. negative for - irregular heartbeat, orthopnea, palpitations, paroxysmal nocturnal dyspnea, rapid heart rate, shortness of breath, or B/near syncope or TIA/amaurosis fugax, claudication  ROS:  Review of Systems - Negative except GI and GU issues noted below.  He mentions that he  is lost about 60 pounds in the last year or so. -GI issues including prolonged BMs.  Hemorrhoids history of benign polyps.  No recent melena, hematochezia -Also awaiting prostate biopsy results.  No hematuria    Objective   PMH notable for  Hypertension: Mother, father, sister, and brother  Current Meds Amlodipine 5 mg tablets-2 tabs daily, Aspirin 81 mg daily,  atorvastatin 20 mg daily, Catapres 0.1 mg twice daily, Mounjaro 12.5 mg / 0.5 mL-10 mL -> 12.5 mg weekly; Klor-Con 10 mill equivalents daily. PRN Imitrex 100 mg every 2 hours as needed migraine; Tessalon Perles 100 mg every 8 hours as needed  Allergies  Allergen Reactions   Amoxicillin     "GI distress"   Benicar [Olmesartan] Swelling    Significant angioedema with respiratory difficulty   Sulfa Antibiotics     Hives     Studies Reviewed: Marland Kitchen   EKG Interpretation Date/Time:  Friday June 26 2023 09:25:48 EST Ventricular Rate:  58 PR Interval:  210 QRS Duration:  96 QT Interval:  410 QTC Calculation: 402 R Axis:   19  Text Interpretation: Sinus bradycardia with 1st degree A-V block T wave abnormality, consider inferior ischemia T wave abnormality, consider anterolateral ischemia When compared with ECG of 17-Aug-2022 18:14, No significant change since last tracing Suspect that ST-T abnormality is  Early repolarization pattern Confirmed by Bryan Lemma ([id number]) on 06/26/2023 9:35:45 AM    ECHO 01/2016: Normal LV size and function.  EF 55 to 60%.  Trace TR.  Otherwise normal He reports he has had 3 coronary catheterizations in his lifetime and told that "he has no blockages around his heart". 1 of these was in [place]. He reports 2 others were performed at [facility].   Lab Results: 01/02/2023: TC 105, TG 71, HDL 34, LDL 56 (excellent); A1c 6.7; Hgb 13.1, CR 1.1, K+ 3.2 No results found for: "CHOL", "HDL", "LDLCALC", "LDLDIRECT", "TRIG", "CHOLHDL"  Risk Assessment/Calculations:            Physical Exam:   VS:  BP 100/70 (BP Location: Left Arm, Patient Position: Sitting, Cuff Size: Large)   Pulse (!) 58   Ht 5' 9.5" (1.765 m)   Wt 249 lb (112.9 kg)   SpO2 99%   BMI 36.24 kg/m    Wt Readings from Last 3 Encounters:  06/26/23 249 lb (112.9 kg)  03/10/23 274 lb (124.3 kg)  08/17/22 274 lb 0.5 oz (124.3 kg)    GEN: Healthy appearing. Well nourished, but  moderately obese., well groomed; in no acute distress;  NECK: No JVD; No carotid bruits CARDIAC: Normal S1, S2; RRR, no murmurs, rubs, gallops RESPIRATORY:  Clear to auscultation without rales, wheezing or rhonchi ; nonlabored, good air movement. ABDOMEN: Soft, non-tender, non-distended EXTREMITIES:  No edema; No deformity; diminished but easily palpable pedal pulses.    ASSESSMENT AND PLAN: .    Primary hypertension If anything a little bit low blood pressure today on his current doses of Catapres and Norvasc. -No change to Norvasc total of 10 mg daily and Catapres 0.1 mg twice daily.  Apparently his HCTZ was discontinued due to hypokalemia  Hyperlipidemia associated with type 2 diabetes mellitus (HCC) Excellent lipid control with an LDL of 56 on modest dose of atorvastatin.  Tolerating well with no myalgias. -Continue atorvastatin 20 mg daily   Longstanding DM-2 with A1c pretty well-controlled at 6.7.  Recently switched from Trulicity to [facility] about a year or so ago and is lost notable  weight between diet change and exercise along with the switch. Not on metformin anymore-that was discontinued -Continue Mounjaro per PCP  Nonspecific abnormal electrocardiogram (ECG) (EKG) Face value, EKG does look abnormal, but he has had lots of issues with T wave inversions and unusual ST-T-segment's in the past.  He has had heart catheterizations on 3 occasions showing no significant disease.  The reason for this consultation was to assess the importance of this EKG.  He is not having any active cardiac symptoms, therefore, it would not be prudent to proceed with noninvasive ischemic evaluation at this time as it would not likely change our management.  Screening for cardiovascular condition In the future for follow-up, we can discuss risk of occasion, but not until after his potential procedures are performed. The fact is, he is currently on aggressive management of cardiovascular risk for  factors with excellent blood pressure, lipid control as well as improving glycemic control.  He is also lost significant weight.  All of these put him at lower risk.  Preop cardiovascular exam     Carlos Primus SR. clearly has cardiovascular risk factors of hypertension, hyperlipidemia, obesity and diabetes mellitus, type II meeting the criteria for Metabolic Syndrome.  He also has an abnormal EKG that seems to be a chronic issue for him and has been fully evaluated in the past.  He has had chronic catheterizations based on these EKG findings that have not shown any significant disease.  He is also completely asymptomatic from angina or heart failure standpoint.  Clinically I would not recommend checking any testing prior to him undergoing surgeries.  Holding aspirin is reasonable as noted.  [name]'s perioperative risk of a major cardiac event is 0.4% according to the Revised Cardiac Risk Index (RCRI).  Therefore, he is at low risk for perioperative complications.   His functional capacity is good at 7.99 METs according to the Duke Activity Status Index (DASI). Recommendations: According to ACC/AHA guidelines, no further cardiovascular testing needed.  The patient may proceed to surgery at acceptable risk.   Antiplatelet and/or Anticoagulation Recommendations: Aspirin can be held for 5-7 days prior to his surgery.  Please resume Aspirin post operatively when it is felt to be safe from a bleeding standpoint.  Not on anticoagulation      Follow-Up: Return in about 8 months (around 02/23/2024) for Routine follow up with me.   I spent 64 minutes in the care of Carlos Primus SR. today including reviewing outside labs from likely PCP via [facility] (2 minutes), face to face time discussing treatment options (37 minutes discussing his medical history, symptoms, the EKG findings and the evaluations he has had for these similar finding EKG changes.  We also discussed the concept of preop assessment and  recommendations.), reviewing records from [facility] from Dr. Azucena Cecil (3 minutes, additional 7 minutes trying to search through care everywhere for studies-having found echocardiogram with results reviewed above), 15 dictating, and documenting in the encounter.    Signed, Marykay Lex, MD, MS Bryan Lemma, M.D., M.S. Interventional Cardiologist  [facility]  Pager # [phone] Phone # [phone] [address] [place], [place] [address]

## 2023-06-26 NOTE — Patient Instructions (Addendum)
Medication Instructions:  No changes  *If you need a refill on your cardiac medications before your next appointment, please call your pharmacy*   Lab Work:  Not needed   Testing/Procedures: Not needed   Follow-Up: At [facility], you and your health needs are our priority.  As part of our continuing mission to provide you with exceptional heart care, we have created designated Provider Care Teams.  These Care Teams include your primary Cardiologist (physician) and Advanced Practice Providers (APPs -  Physician Assistants and Nurse Practitioners) who all work together to provide you with the care you need, when you need it.     Your next appointment:   8 to 9 month(s) ( Sept /Oct)   The format for your next appointment:   In Person  Provider:   Bryan Lemma, MD    Other Instructions    Okay from cardiac standpoint to have surgery

## 2023-06-27 ENCOUNTER — Encounter: Payer: Self-pay | Admitting: Cardiology

## 2023-06-27 DIAGNOSIS — Z136 Encounter for screening for cardiovascular disorders: Secondary | ICD-10-CM | POA: Insufficient documentation

## 2023-06-27 NOTE — Assessment & Plan Note (Signed)
If anything a little bit low blood pressure today on his current doses of Catapres and Norvasc. -No change to Norvasc total of 10 mg daily and Catapres 0.1 mg twice daily.  Apparently his HCTZ was discontinued due to hypokalemia

## 2023-06-27 NOTE — Assessment & Plan Note (Signed)
   Carlos Primus SR. clearly has cardiovascular risk factors of hypertension, hyperlipidemia, obesity and diabetes mellitus, type II meeting the criteria for Metabolic Syndrome.  He also has an abnormal EKG that seems to be a chronic issue for him and has been fully evaluated in the past.  He has had chronic catheterizations based on these EKG findings that have not shown any significant disease.  He is also completely asymptomatic from angina or heart failure standpoint.  Clinically I would not recommend checking any testing prior to him undergoing surgeries.  Holding aspirin is reasonable as noted.  [name]'s perioperative risk of a major cardiac event is 0.4% according to the Revised Cardiac Risk Index (RCRI).  Therefore, he is at low risk for perioperative complications.   His functional capacity is good at 7.99 METs according to the Duke Activity Status Index (DASI). Recommendations: According to ACC/AHA guidelines, no further cardiovascular testing needed.  The patient may proceed to surgery at acceptable risk.   Antiplatelet and/or Anticoagulation Recommendations: Aspirin can be held for 5-7 days prior to his surgery.  Please resume Aspirin post operatively when it is felt to be safe from a bleeding standpoint.  Not on anticoagulation

## 2023-06-27 NOTE — Assessment & Plan Note (Addendum)
Excellent lipid control with an LDL of 56 on modest dose of atorvastatin.  Tolerating well with no myalgias. -Continue atorvastatin 20 mg daily   Longstanding DM-2 with A1c pretty well-controlled at 6.7.  Recently switched from Trulicity to [facility] about a year or so ago and is lost notable weight between diet change and exercise along with the switch. Not on metformin anymore-that was discontinued -Continue Mounjaro per PCP

## 2023-06-27 NOTE — Assessment & Plan Note (Signed)
In the future for follow-up, we can discuss risk of occasion, but not until after his potential procedures are performed. The fact is, he is currently on aggressive management of cardiovascular risk for factors with excellent blood pressure, lipid control as well as improving glycemic control.  He is also lost significant weight.  All of these put him at lower risk.

## 2023-06-27 NOTE — Assessment & Plan Note (Addendum)
Face value, EKG does look abnormal, but he has had lots of issues with T wave inversions and unusual ST-T-segment's in the past.  He has had heart catheterizations on 3 occasions showing no significant disease.  The reason for this consultation was to assess the importance of this EKG.  He is not having any active cardiac symptoms, therefore, it would not be prudent to proceed with noninvasive ischemic evaluation at this time as it would not likely change our management.

## 2023-07-06 ENCOUNTER — Telehealth: Payer: Self-pay | Admitting: Infectious Disease

## 2023-07-06 ENCOUNTER — Other Ambulatory Visit: Payer: Self-pay

## 2023-07-06 ENCOUNTER — Encounter: Payer: Self-pay | Admitting: Infectious Disease

## 2023-07-06 ENCOUNTER — Ambulatory Visit (INDEPENDENT_AMBULATORY_CARE_PROVIDER_SITE_OTHER): Payer: BC Managed Care – PPO | Admitting: Infectious Disease

## 2023-07-06 ENCOUNTER — Ambulatory Visit: Payer: 59

## 2023-07-06 VITALS — BP 143/91 | HR 53 | Temp 97.9°F | Wt 249.0 lb

## 2023-07-06 DIAGNOSIS — R350 Frequency of micturition: Secondary | ICD-10-CM

## 2023-07-06 DIAGNOSIS — Z87448 Personal history of other diseases of urinary system: Secondary | ICD-10-CM

## 2023-07-06 DIAGNOSIS — R339 Retention of urine, unspecified: Secondary | ICD-10-CM

## 2023-07-06 DIAGNOSIS — N401 Enlarged prostate with lower urinary tract symptoms: Secondary | ICD-10-CM

## 2023-07-06 DIAGNOSIS — R8271 Bacteriuria: Secondary | ICD-10-CM | POA: Insufficient documentation

## 2023-07-06 DIAGNOSIS — E1169 Type 2 diabetes mellitus with other specified complication: Secondary | ICD-10-CM

## 2023-07-06 DIAGNOSIS — Z7985 Long-term (current) use of injectable non-insulin antidiabetic drugs: Secondary | ICD-10-CM

## 2023-07-06 DIAGNOSIS — K389 Disease of appendix, unspecified: Secondary | ICD-10-CM

## 2023-07-06 DIAGNOSIS — R319 Hematuria, unspecified: Secondary | ICD-10-CM

## 2023-07-06 DIAGNOSIS — R3915 Urgency of urination: Secondary | ICD-10-CM | POA: Insufficient documentation

## 2023-07-06 HISTORY — DX: Urgency of urination: R39.15

## 2023-07-06 HISTORY — DX: Bacteriuria: R82.71

## 2023-07-06 NOTE — Telephone Encounter (Signed)
 Today's office note routed to PCP.   Sidney Silberman D Geovana Gebel, RN

## 2023-07-06 NOTE — Telephone Encounter (Signed)
 Treysen requested his office notes be sent to his PCP, Candace Smith of [facility].

## 2023-07-06 NOTE — Progress Notes (Signed)
 Subjective:  Is in for infectious disease consult: Recurrent urinary symptoms bacteriuria, UTIs  Requesting physician Leila Punt, MD   Patient ID: Carlos Seed SR., male    DOB: [birth year], 58 y.o.   MRN: [id number]  HPI  ,Discussed the use of AI scribe software for clinical note transcription with the patient, who gave verbal consent to proceed.  History of Present Illness   The patient, a 58 year old with a history of prostatitis and an enlarged prostate, presents with chronic urinary symptoms. The patient reports frequent urination, difficulty urinating, and occasional hematuria. He CAN have dysuria though not frequently. The patient denies abdominal pain but reports occasional groin discomfort. The patient has been on antibiotics for the past six months due to recurrent positive urine cultures for E. coli. Despite this, the patient continues to experience urinary symptoms. The patient also reports that he has been on Gemtesa for overactive bladder and Alfuzosin for BPH. The patient has a history of diabetes and was previously on Lasix but was taken off due to concerns about potassium levels. The patient is scheduled for an appendectomy in the near future.       Past Medical History:  Diagnosis Date   Colon polyp    Diabetes mellitus without complication (HCC)    Hypercholesteremia    Hypertension    Kidney stone    Migraines    Prostatitis     Past Surgical History:  Procedure Laterality Date   CARDIAC CATHETERIZATION  2011   [facility]: Per report, no significant disease.  Similar to cardiac catheterization in the 1990s (1995) 1995, 2011   THYROIDECTOMY      Family History  Problem Relation Age of Onset   Brain cancer Mother    Diabetes Father    Stroke Father       Social History   Socioeconomic History   Marital status: Married    Spouse name: Not on file   Number of children: Not on file   Years of education: Not on file   Highest education  level: Not on file  Occupational History   Not on file  Tobacco Use   Smoking status: Never   Smokeless tobacco: Never  Vaping Use   Vaping status: Never Used  Substance and Sexual Activity   Alcohol use: Not Currently   Drug use: Never   Sexual activity: Not on file  Other Topics Concern   Not on file  Social History Narrative   Not on file   Social Drivers of Health   Financial Resource Strain: Not on file  Food Insecurity: No Food Insecurity (07/05/2020)   Received from [facility], [facility]   Hunger Vital Sign    Worried About Running Out of Food in the Last Year: Never true    Ran Out of Food in the Last Year: Never true  Transportation Needs: Not on file  Physical Activity: Not on file  Stress: Not on file  Social Connections: Unknown (10/08/2021)   Received from [facility], [facility]   Social Network    Social Network: Not on file    Allergies  Allergen Reactions   Amoxicillin     "GI distress"   Benicar  [Olmesartan ] Swelling    Significant angioedema with respiratory difficulty   Sulfa Antibiotics     Hives      Current Outpatient Medications:    acetaminophen  (TYLENOL ) 325 MG tablet, Take 650 mg by mouth every 6 (six) hours as needed for fever., Disp: ,  Rfl:    amLODipine  (NORVASC ) 5 MG tablet, Take 2 tablets (10 mg total) by mouth daily., Disp: 60 tablet, Rfl: 0   aspirin  EC 81 MG tablet, Take 81 mg by mouth daily., Disp: , Rfl:    atorvastatin  (LIPITOR) 20 MG tablet, Take 20 mg by mouth daily. Reported on 10/29/2015, Disp: , Rfl:    benzonatate  (TESSALON ) 100 MG capsule, Take 1 capsule (100 mg total) by mouth every 8 (eight) hours., Disp: 21 capsule, Rfl: 0   cloNIDine (CATAPRES) 0.1 MG tablet, Take 0.1 mg by mouth 2 (two) times daily., Disp: , Rfl:    diphenhydrAMINE  (BENADRYL ) 25 MG tablet, Take 1 tablet (25 mg total) by mouth every 6 (six) hours as needed (Use for migraine, call PCP if no improvement after 2 tablets)., Disp: 30 tablet, Rfl:  0   omeprazole  (PRILOSEC) 40 MG capsule, Take 40 mg by mouth 2 (two) times daily., Disp: , Rfl:    ondansetron  (ZOFRAN ) 4 MG tablet, Take 1 tablet (4 mg total) by mouth every 6 (six) hours., Disp: 12 tablet, Rfl: 0   potassium chloride  (K-DUR) 10 MEQ tablet, Take 10 mEq by mouth daily. with food, Disp: , Rfl:    potassium chloride  SA (KLOR-CON  M) 20 MEQ tablet, Take 1 tablet (20 mEq total) by mouth 2 (two) times daily for 10 days., Disp: 20 tablet, Rfl: 0   potassium chloride  SA (KLOR-CON ) 20 MEQ tablet, Take 2 tablets (40 mEq total) by mouth daily for 5 days., Disp: 10 tablet, Rfl: 0   Rimegepant Sulfate (NURTEC PO), Take by mouth., Disp: , Rfl:    SUMAtriptan (IMITREX) 100 MG tablet, Take 100 mg by mouth every 2 (two) hours as needed for migraine. , Disp: , Rfl:    tirzepatide  (MOUNJARO ) 10 MG/0.5ML Pen, Inject 10 mg into the skin once a week., Disp: 2 mL, Rfl: 3   tirzepatide  (MOUNJARO ) 10 MG/0.5ML Pen, Inject 10 mg into the skin once a week., Disp: 6 mL, Rfl: 4   TRULICITY 0.75 MG/0.5ML SOPN, SMARTSIG:0.5 Milliliter(s) SUB-Q Once a Week (Patient not taking: Reported on 06/26/2023), Disp: , Rfl:     Review of Systems  Constitutional:  Negative for activity change, appetite change, chills, diaphoresis, fatigue, fever and unexpected weight change.  HENT:  Negative for congestion, rhinorrhea, sinus pressure, sneezing, sore throat and trouble swallowing.   Eyes:  Negative for photophobia and visual disturbance.  Respiratory:  Negative for cough, chest tightness, shortness of breath, wheezing and stridor.   Cardiovascular:  Negative for chest pain, palpitations and leg swelling.  Gastrointestinal:  Negative for abdominal distention, abdominal pain, anal bleeding, blood in stool, constipation, diarrhea, nausea and vomiting.  Endocrine: Positive for polyuria.  Genitourinary:  Positive for frequency, hematuria and urgency. Negative for difficulty urinating, dysuria, flank pain, penile discharge,  penile pain, penile swelling, scrotal swelling and testicular pain.  Musculoskeletal:  Negative for arthralgias, back pain, gait problem, joint swelling and myalgias.  Skin:  Negative for color change, pallor, rash and wound.  Neurological:  Negative for dizziness, tremors, weakness and light-headedness.  Hematological:  Negative for adenopathy. Does not bruise/bleed easily.  Psychiatric/Behavioral:  Negative for agitation, behavioral problems, confusion, decreased concentration, dysphoric mood and sleep disturbance.        Objective:   Physical Exam Constitutional:      Appearance: He is well-developed.  HENT:     Head: Normocephalic and atraumatic.  Eyes:     Conjunctiva/sclera: Conjunctivae normal.  Cardiovascular:     Rate and  Rhythm: Normal rate and regular rhythm.  Pulmonary:     Effort: Pulmonary effort is normal. No respiratory distress.     Breath sounds: No wheezing.  Abdominal:     General: There is no distension.     Palpations: Abdomen is soft.  Musculoskeletal:        General: No tenderness. Normal range of motion.     Cervical back: Normal range of motion and neck supple.  Skin:    General: Skin is warm and dry.     Coloration: Skin is not pale.     Findings: No erythema or rash.  Neurological:     General: No focal deficit present.     Mental Status: He is alert and oriented to person, place, and time.  Psychiatric:        Mood and Affect: Mood normal.        Behavior: Behavior normal.        Thought Content: Thought content normal.        Judgment: Judgment normal.           Assessment & Plan:   Assessment and Plan    Chronic Urinary Symptoms Frequent urination, difficulty voiding, and occasional hematuria. No abdominal pain. Large prostate and history of prostatitis. Currently on Gemtesa 75mg  for overactive bladder and Alfuzosin for BPH. -Discontinue chronic antibiotic use due to lack of compelling evidence for recurrent UTI and risk of  antibiotic resistance and need to use antibiotics judiciously ----certainly IF he has symptoms of dysuria, suprapubic discomfort, flank pain then THOSE would be suggestive of UTI, but pyuria and/or a + urine culture would NOT in itself be grounds for diagnosis of UTI and treatment for this --certainly he likely has UTI's from time to time though his chronic symptoms do not fit with infection -follow-up in 3 months to assess urinary symptoms off antibiotics.  Type 2 Diabetes Increased urination may be exacerbated by diabetes. -Continue current management.  Hypertension Previously on Lasix, but discontinued due to low potassium levels. -Continue current management.  Upcoming Surgery -Ensure appropriate perioperative management of medications and potential UTI risk.  Testosterone Levels Patient has not had testosterone levels checked in a while and is considering testosterone therapy. -Advise patient to discuss with urologist potential impact on prostate and urinary symptoms before starting therapy.     I have personally spent 80 minutes involved in face-to-face and non-face-to-face activities for this patient on the day of the visit. Professional time spent includes the following activities: Preparing to see the patient (review of tests), Obtaining and/or reviewing separately obtained history (admission/discharge record), Performing a medically appropriate examination and/or evaluation , Ordering medications/tests/procedures, referring and communicating with other health care professionals, Documenting clinical information in the EMR, Independently interpreting results (not separately reported), Communicating results to the patient/family/caregiver, Counseling and educating the patient/family/caregiver and Care coordination (not separately reported).

## 2023-07-06 NOTE — Progress Notes (Signed)
 Patient presents today to pick up custom molded foot orthotics, diagnosed with Metatarsalgia by Dr. Michalene Agee.   Orthotics were dispensed and fit was satisfactory. Reviewed instructions for break-in and wear. Written instructions given to patient.  Patient will follow up as needed.  Britton Cane CPed, CFo, CFm

## 2023-07-19 ENCOUNTER — Encounter: Payer: Self-pay | Admitting: Cardiology

## 2023-08-07 ENCOUNTER — Inpatient Hospital Stay (HOSPITAL_COMMUNITY): Admission: RE | Admit: 2023-08-07 | Source: Ambulatory Visit

## 2023-08-10 ENCOUNTER — Encounter: Payer: Self-pay | Admitting: Cardiology

## 2023-08-10 NOTE — Progress Notes (Signed)
 Echocardiogram report from [facility] Dated 09/27/2016: Normal LV size with hyperdynamic LV function.  EF 75 to 80%.  Mild to moderate LVH.  Left and right atria.  Normal valves.  This echo looks pretty normal just suggest that your heart was functioning quite vigorously.Bryan Lemma, MD

## 2023-08-31 ENCOUNTER — Ambulatory Visit: Payer: Self-pay | Admitting: General Surgery

## 2023-08-31 NOTE — Pre-Procedure Instructions (Signed)
 Surgical Instructions   Your procedure is scheduled on Thursday, April 17th. Report to [facility] Main Entrance "A" at 11:00 A.M., then check in with the Admitting office. Any questions or running late day of surgery: call [phone]  Questions prior to your surgery date: call [phone], Monday-Friday, 8am-4pm. If you experience any cold or flu symptoms such as cough, fever, chills, shortness of breath, etc. between now and your scheduled surgery, please notify us at the above number.     Remember:  Do not eat after midnight the night before your surgery   You may drink clear liquids until 10:00 AM the morning of your surgery.   Clear liquids allowed are: Water, Non-Citrus Juices (without pulp), Carbonated Beverages, Clear Tea (no milk, honey, etc.), Black Coffee Only (NO MILK, CREAM OR POWDERED CREAMER of any kind), and Gatorade.    Take these medicines the morning of surgery with A SIP OF WATER  alfuzosin (UROXATRAL)  amLODipine (NORVASC)  cloNIDine (CATAPRES)  levothyroxine (SYNTHROID)    May take these medicines IF NEEDED: acetaminophen (TYLENOL)  Propylene Glycol, PF, (SYSTANE COMPLETE PF) eye drops Rimegepant Sulfate    Hold Aspirin 5-7 days prior to surgery. Last dose 4/9-4/11.  One week prior to surgery, STOP taking any Aleve, Naproxen, Ibuprofen, Motrin, Advil, Goody's, BC's, all herbal medications, fish oil, and non-prescription vitamins.  WHAT DO I DO ABOUT MY DIABETES MEDICATION?   STOP taking tirzepatide [facility]) 7 days prior to surgery. Last dose 4/9.   HOW TO MANAGE YOUR DIABETES BEFORE AND AFTER SURGERY  Why is it important to control my blood sugar before and after surgery? Improving blood sugar levels before and after surgery helps healing and can limit problems. A way of improving blood sugar control is eating a healthy diet by:  Eating less sugar and carbohydrates  Increasing activity/exercise  Talking with your doctor about reaching your  blood sugar goals High blood sugars (greater than 180 mg/dL) can raise your risk of infections and slow your recovery, so you will need to focus on controlling your diabetes during the weeks before surgery. Make sure that the doctor who takes care of your diabetes knows about your planned surgery including the date and location.  How do I manage my blood sugar before surgery? Check your blood sugar at least 4 times a day, starting 2 days before surgery, to make sure that the level is not too high or low.  Check your blood sugar the morning of your surgery when you wake up and every 2 hours until you get to the Short Stay unit.  If your blood sugar is less than 70 mg/dL, you will need to treat for low blood sugar: Do not take insulin. Treat a low blood sugar (less than 70 mg/dL) with  cup of clear juice (cranberry or apple), 4 glucose tablets, OR glucose gel. Recheck blood sugar in 15 minutes after treatment (to make sure it is greater than 70 mg/dL). If your blood sugar is not greater than 70 mg/dL on recheck, call [phone] for further instructions. Report your blood sugar to the short stay nurse when you get to Short Stay.  If you are admitted to the hospital after surgery: Your blood sugar will be checked by the staff and you will probably be given insulin after surgery (instead of oral diabetes medicines) to make sure you have good blood sugar levels. The goal for blood sugar control after surgery is 80-180 mg/dL.  Do NOT Smoke (Tobacco/Vaping) for 24 hours prior to your procedure.  If you use a CPAP at night, you may bring your mask/headgear for your overnight stay.   You will be asked to remove any contacts, glasses, piercing's, hearing aid's, dentures/partials prior to surgery. Please bring cases for these items if needed.    Patients discharged the day of surgery will not be allowed to drive home, and someone needs to stay with them for 24 hours.  SURGICAL  WAITING ROOM VISITATION Patients may have no more than 2 support people in the waiting area - these visitors may rotate.   Pre-op nurse will coordinate an appropriate time for 1 ADULT support person, who may not rotate, to accompany patient in pre-op.  Children under the age of 37 must have an adult with them who is not the patient and must remain in the main waiting area with an adult.  If the patient needs to stay at the hospital during part of their recovery, the visitor guidelines for inpatient rooms apply.  Please refer to the [facility] website for the visitor guidelines for any additional information.   If you received a COVID test during your pre-op visit  it is requested that you wear a mask when out in public, stay away from anyone that may not be feeling well and notify your surgeon if you develop symptoms. If you have been in contact with anyone that has tested positive in the last 10 days please notify you surgeon.      Pre-operative CHG Bathing Instructions   You can play a key role in reducing the risk of infection after surgery. Your skin needs to be as free of germs as possible. You can reduce the number of germs on your skin by washing with CHG (chlorhexidine gluconate) soap before surgery. CHG is an antiseptic soap that kills germs and continues to kill germs even after washing.   DO NOT use if you have an allergy to chlorhexidine/CHG or antibacterial soaps. If your skin becomes reddened or irritated, stop using the CHG and notify one of our RNs at [phone].              TAKE A SHOWER THE NIGHT BEFORE SURGERY AND THE DAY OF SURGERY    Please keep in mind the following:  DO NOT shave, including legs and underarms, 48 hours prior to surgery.   You may shave your face before/day of surgery.  Place clean sheets on your bed the night before surgery Use a clean washcloth (not used since being washed) for each shower. DO NOT sleep with pet's night before surgery.  CHG  Shower Instructions:  Wash your face and private area with normal soap. If you choose to wash your hair, wash first with your normal shampoo.  After you use shampoo/soap, rinse your hair and body thoroughly to remove shampoo/soap residue.  Turn the water OFF and apply half the bottle of CHG soap to a CLEAN washcloth.  Apply CHG soap ONLY FROM YOUR NECK DOWN TO YOUR TOES (washing for 3-5 minutes)  DO NOT use CHG soap on face, private areas, open wounds, or sores.  Pay special attention to the area where your surgery is being performed.  If you are having back surgery, having someone wash your back for you may be helpful. Wait 2 minutes after CHG soap is applied, then you may rinse off the CHG soap.  Pat dry with a clean towel  Put on clean pajamas  Additional instructions for the day of surgery: DO NOT APPLY any lotions, deodorants, cologne, or perfumes.   Do not wear jewelry or makeup Do not wear nail polish, gel polish, artificial nails, or any other type of covering on natural nails (fingers and toes) Do not bring valuables to the hospital. [facility] is not responsible for valuables/personal belongings. Put on clean/comfortable clothes.  Please brush your teeth.  Ask your nurse before applying any prescription medications to the skin.

## 2023-09-01 ENCOUNTER — Encounter (HOSPITAL_COMMUNITY)
Admission: RE | Admit: 2023-09-01 | Discharge: 2023-09-01 | Disposition: A | Discharge status: Home / Self Care | Source: Ambulatory Visit | Attending: General Surgery | Admitting: General Surgery

## 2023-09-01 ENCOUNTER — Other Ambulatory Visit: Payer: Self-pay

## 2023-09-01 ENCOUNTER — Encounter (HOSPITAL_COMMUNITY): Payer: Self-pay

## 2023-09-01 VITALS — BP 132/99 | HR 66 | Temp 98.4°F | Resp 18 | Ht 70.0 in | Wt 245.0 lb

## 2023-09-01 DIAGNOSIS — N419 Inflammatory disease of prostate, unspecified: Secondary | ICD-10-CM | POA: Insufficient documentation

## 2023-09-01 DIAGNOSIS — Z01818 Encounter for other preprocedural examination: Secondary | ICD-10-CM

## 2023-09-01 DIAGNOSIS — D564 Hereditary persistence of fetal hemoglobin [HPFH]: Secondary | ICD-10-CM | POA: Insufficient documentation

## 2023-09-01 DIAGNOSIS — E7800 Pure hypercholesterolemia, unspecified: Secondary | ICD-10-CM | POA: Diagnosis not present

## 2023-09-01 DIAGNOSIS — I10 Essential (primary) hypertension: Secondary | ICD-10-CM | POA: Insufficient documentation

## 2023-09-01 DIAGNOSIS — E119 Type 2 diabetes mellitus without complications: Secondary | ICD-10-CM | POA: Diagnosis not present

## 2023-09-01 DIAGNOSIS — E890 Postprocedural hypothyroidism: Secondary | ICD-10-CM | POA: Insufficient documentation

## 2023-09-01 DIAGNOSIS — Z01812 Encounter for preprocedural laboratory examination: Secondary | ICD-10-CM | POA: Insufficient documentation

## 2023-09-01 HISTORY — DX: Gastro-esophageal reflux disease without esophagitis: K21.9

## 2023-09-01 HISTORY — DX: Anemia, unspecified: D64.9

## 2023-09-01 HISTORY — DX: Anxiety disorder, unspecified: F41.9

## 2023-09-01 HISTORY — DX: Hypothyroidism, unspecified: E03.9

## 2023-09-01 LAB — BASIC METABOLIC PANEL WITH GFR
Anion gap: 7 (ref 5–15)
BUN: 14 mg/dL (ref 6–20)
CO2: 27 mmol/L (ref 22–32)
Calcium: 9.3 mg/dL (ref 8.9–10.3)
Chloride: 108 mmol/L (ref 98–111)
Creatinine, Ser: 1.09 mg/dL (ref 0.61–1.24)
GFR, Estimated: >60 mL/min (ref >60)
Glucose, Bld: 120 mg/dL — ABNORMAL HIGH (ref 70–99)
Potassium: 3.7 mmol/L (ref 3.5–5.1)
Sodium: 142 mmol/L (ref 135–145)

## 2023-09-01 LAB — CBC
HCT: 42.3 % (ref 39.0–52.0)
Hemoglobin: 14 g/dL (ref 13.0–17.0)
MCH: 27.5 pg (ref 26.0–34.0)
MCHC: 33.1 g/dL (ref 30.0–36.0)
MCV: 82.9 fL (ref 80.0–100.0)
Platelets: 203 10*3/uL (ref 150–400)
RBC: 5.1 MIL/uL (ref 4.22–5.81)
RDW: 13.8 % (ref 11.5–15.5)
WBC: 3.6 10*3/uL — ABNORMAL LOW (ref 4.0–10.5)
nRBC: 0 % (ref 0.0–0.2)

## 2023-09-01 LAB — GLUCOSE, CAPILLARY: Glucose-Capillary: 114 mg/dL — ABNORMAL HIGH (ref 70–99)

## 2023-09-01 NOTE — Progress Notes (Signed)
 PCP - Merri Brunette, MD Cardiologist - Bryan Lemma, MD  PPM/ICD - denies Device Orders - n/a Rep Notified - n/a  Chest x-ray - denies EKG - 06/26/2023 Stress Test - denies ECHO - 09/27/2016 Cardiac Cath - 1995 and 2011  Sleep Study - yes, > 5 years ago CPAP - denies  Fasting Blood Sugar - 114 at PAT (DMII) Checks Blood Sugar 3 times a week; CBG 110-117  Last dose of GLP1 agonist-  09/01/2023 GLP1 instructions: hold  tirzepatide [facility]) 7 days prior to sx  Blood Thinner Instructions: n/a Aspirin Instructions: hold 5-7 days prior to sx. Last dose - 09/02/2023  ERAS Protcol -no, NPO PRE-SURGERY Ensure or G2- n/a  COVID TEST- n/a   Anesthesia review: yes, cardiac clearance on 06/26/2023. 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 Per pt he has done a different blood test recently and his DM2 is under control.   Patient denies shortness of breath, fever, cough and chest pain at PAT appointment   All instructions explained to the patient, with a verbal understanding of the material. Patient agrees to go over the instructions while at home for a better understanding. Patient also instructed to self quarantine after being tested for COVID-19. The opportunity to ask questions was provided.

## 2023-09-01 NOTE — Pre-Procedure Instructions (Signed)
 Surgical Instructions     Your procedure is scheduled on Thursday, April 17th. Report to [facility] Main Entrance "A" at 11:00 A.M., then check in with the Admitting office. Any questions or running late day of surgery: call [phone]   Questions prior to your surgery date: call [phone], Monday-Friday, 8am-4pm. If you experience any cold or flu symptoms such as cough, fever, chills, shortness of breath, etc. between now and your scheduled surgery, please notify us at the above number.            Remember:       Do not eat or drink after midnight the night before your surgery          Take these medicines the morning of surgery with A SIP OF WATER  alfuzosin (UROXATRAL)  amLODipine (NORVASC)  cloNIDine (CATAPRES)  levothyroxine (SYNTHROID)      May take these medicines IF NEEDED: acetaminophen (TYLENOL)  Propylene Glycol, PF, (SYSTANE COMPLETE PF) eye drops Rimegepant Sulfate      Hold Aspirin 5-7 days prior to surgery. Last dose 4/9-4/11.   One week prior to surgery, STOP taking any Aleve, Naproxen, Ibuprofen, Motrin, Advil, Goody's, BC's, all herbal medications, fish oil, and non-prescription vitamins.   WHAT DO I DO ABOUT MY DIABETES MEDICATION?     STOP taking tirzepatide [facility]) 7 days prior to surgery. Last dose 4/9.     HOW TO MANAGE YOUR DIABETES BEFORE AND AFTER SURGERY   Why is it important to control my blood sugar before and after surgery? Improving blood sugar levels before and after surgery helps healing and can limit problems. A way of improving blood sugar control is eating a healthy diet by:  Eating less sugar and carbohydrates  Increasing activity/exercise  Talking with your doctor about reaching your blood sugar goals High blood sugars (greater than 180 mg/dL) can raise your risk of infections and slow your recovery, so you will need to focus on controlling your diabetes during the weeks before surgery. Make sure that the doctor who takes  care of your diabetes knows about your planned surgery including the date and location.   How do I manage my blood sugar before surgery? Check your blood sugar at least 4 times a day, starting 2 days before surgery, to make sure that the level is not too high or low.   Check your blood sugar the morning of your surgery when you wake up and every 2 hours until you get to the Short Stay unit.   If your blood sugar is less than 70 mg/dL, you will need to treat for low blood sugar: Do not take insulin. Treat a low blood sugar (less than 70 mg/dL) with  cup of clear juice (cranberry or apple), 4 glucose tablets, OR glucose gel. Recheck blood sugar in 15 minutes after treatment (to make sure it is greater than 70 mg/dL). If your blood sugar is not greater than 70 mg/dL on recheck, call [phone] for further instructions. Report your blood sugar to the short stay nurse when you get to Short Stay.   If you are admitted to the hospital after surgery: Your blood sugar will be checked by the staff and you will probably be given insulin after surgery (instead of oral diabetes medicines) to make sure you have good blood sugar levels. The goal for blood sugar control after surgery is 80-180 mg/dL.  Do NOT Smoke (Tobacco/Vaping) for 24 hours prior to your procedure.   If you use a CPAP at night, you may bring your mask/headgear for your overnight stay.   You will be asked to remove any contacts, glasses, piercing's, hearing aid's, dentures/partials prior to surgery. Please bring cases for these items if needed.    Patients discharged the day of surgery will not be allowed to drive home, and someone needs to stay with them for 24 hours.   SURGICAL WAITING ROOM VISITATION Patients may have no more than 2 support people in the waiting area - these visitors may rotate.   Pre-op nurse will coordinate an appropriate time for 1 ADULT support person, who may not rotate, to accompany  patient in pre-op.  Children under the age of 53 must have an adult with them who is not the patient and must remain in the main waiting area with an adult.   If the patient needs to stay at the hospital during part of their recovery, the visitor guidelines for inpatient rooms apply.   Please refer to the [facility] website for the visitor guidelines for any additional information.     If you received a COVID test during your pre-op visit  it is requested that you wear a mask when out in public, stay away from anyone that may not be feeling well and notify your surgeon if you develop symptoms. If you have been in contact with anyone that has tested positive in the last 10 days please notify you surgeon.         Pre-operative CHG Bathing Instructions    You can play a key role in reducing the risk of infection after surgery. Your skin needs to be as free of germs as possible. You can reduce the number of germs on your skin by washing with CHG (chlorhexidine gluconate) soap before surgery. CHG is an antiseptic soap that kills germs and continues to kill germs even after washing.    DO NOT use if you have an allergy to chlorhexidine/CHG or antibacterial soaps. If your skin becomes reddened or irritated, stop using the CHG and notify one of our RNs at [phone].               TAKE A SHOWER THE NIGHT BEFORE SURGERY AND THE DAY OF SURGERY     Please keep in mind the following:  DO NOT shave, including legs and underarms, 48 hours prior to surgery.   You may shave your face before/day of surgery.  Place clean sheets on your bed the night before surgery Use a clean washcloth (not used since being washed) for each shower. DO NOT sleep with pet's night before surgery.   CHG Shower Instructions:  Wash your face and private area with normal soap. If you choose to wash your hair, wash first with your normal shampoo.  After you use shampoo/soap, rinse your hair and body thoroughly to remove  shampoo/soap residue.  Turn the water OFF and apply half the bottle of CHG soap to a CLEAN washcloth.  Apply CHG soap ONLY FROM YOUR NECK DOWN TO YOUR TOES (washing for 3-5 minutes)  DO NOT use CHG soap on face, private areas, open wounds, or sores.  Pay special attention to the area where your surgery is being performed.  If you are having back surgery, having someone wash your back for you may be helpful. Wait 2 minutes after CHG soap is applied, then you may rinse off the CHG  soap.  Pat dry with a clean towel  Put on clean pajamas     Additional instructions for the day of surgery: DO NOT APPLY any lotions, deodorants, cologne, or perfumes.   Do not wear jewelry or makeup Do not wear nail polish, gel polish, artificial nails, or any other type of covering on natural nails (fingers and toes) Do not bring valuables to the hospital. [facility] is not responsible for valuables/personal belongings. Put on clean/comfortable clothes.  Please brush your teeth.  Ask your nurse before applying any prescription medications to the skin.

## 2023-09-02 NOTE — Progress Notes (Signed)
 Anesthesia Chart Review:  Case: [id number] Date/Time: 09/10/23 1245   Procedure: LAPAROSCOPIC APPENDECTOMY   Anesthesia type: General   Pre-op diagnosis: thickened appendix   Location: [facility] OR ROOM 02 / [facility] OR   Surgeons: Lysle Rubens, MD       DISCUSSION: Patient is a 58 year old male scheduled for the above procedure. He had an abnormally thickened appendix on prostate MRI on 05/26/23, so general surgery referral recommended.   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  He had preoperative cardiology evaluation by Dr. Herbie Baltimore on 06/26/2023 for abnormal EKG. He was being considered for appendectomy, as well as possible future prostate surgery. He reported at least two previous cardiac catheterizations ([place] ~ 1996 & 2008 in [facility]) that showed no significant CAD. These were done in part for abnormal EKG with T wave abnormalities. He denied chest pain, SOB at rest, palpitations, orthopnea, syncope, claudication, peripheral edema. He had become somewhat sedentary with some deconditioning related exertional dyspnea. HLD well controlled on atrovastatin. DM felt to be well-controlled.  He had lost weight on Mounjaro.  Dr. Herbie Baltimore did not have access to cardiac cath reports, but EKG abnormality dated back to at least 2008 with reported normal cath then. He also denied CV symptoms. 02/06/16 echo showed LVEF 55-60% no significant valvular disease. In regards to preoperative risk assessment, he wrote: "Mr. Norfleet SR.'s perioperative risk of a major cardiac event is 0.4% according to the Revised Cardiac Risk Index (RCRI).  Therefore, he is at low risk for perioperative complications.   His functional capacity is good at 7.99 METs according to the Duke Activity Status Index (DASI). Recommendations: According to ACC/AHA guidelines, no  further cardiovascular testing needed.  The patient may proceed to surgery at acceptable risk.   Antiplatelet and/or Anticoagulation Recommendations: Aspirin can be held for 5-7 days prior to his surgery.  Please resume Aspirin post operatively when it is felt to be safe from a bleeding standpoint.  Not on anticoagulation".  DM and hypothyroidism are followed by Dr. Sharl Ma. Patient noted that his A1c results may be unreliable given hereditary persistence of fetal hemoglobin (HbF). He is on [facility] and was advised to hold for 7 days prior to surgery, last dose planned  for 09/01/23.  Last ASA is planned for 09/02/23.   Anesthesia team to evaluate on the day of surgery.   VS: BP (!) 132/99   Pulse 66   Temp 36.9 C   Resp 18   Ht 5\' 10"  (1.778 m)   Wt 111.1 kg   SpO2 100%   BMI 35.15 kg/m   PROVIDERS: Merri Brunette, MD is PCP Bryan Lemma, MD is cardiologist Talmage Coin, MD is endocrinologist Modena Slater, MD is urologist Daiva Eves, Remi Haggard, MD is ID   LABS: Labs reviewed: Acceptable for surgery. (all labs ordered are listed, but only abnormal results are displayed)  Labs Reviewed  GLUCOSE, CAPILLARY - Abnormal; Notable for the following components:      Result Value   Glucose-Capillary 114 (*)    All other components within normal limits  BASIC METABOLIC PANEL WITH GFR - Abnormal; Notable for the following components:   Glucose, Bld 120 (*)    All other components within normal limits  CBC - Abnormal; Notable for the following components:   WBC 3.6 (*)    All other components within normal limits     IMAGES: MRI Prostate 05/26/23: IMPRESSION: 1.  Two PI-RADS category 4 lesions in the peripheral zone. Targeting data sent to [facility]. 2. Suspected abnormally thickened appendix up to 1.4 cm diameter. Possibilities might include appendicitis, appendiceal mucocele, or appendiceal tumor. Surgical consultation is recommended, with level of urgency dependent on patient's  symptoms. 3. Prostatomegaly and benign prostatic hypertrophy.   CT Abd/pelvis 04/09/23: MPRESSION: - No radiographic evidence of urinary tract neoplasm, urolithiasis, or hydronephrosis. - Mild-to-moderately enlarged prostate.    EKG: 06/26/23: Sinus bradycardia with 1st degree A-V block T wave abnormality, consider inferior ischemia T wave abnormality, consider anterolateral ischemia When compared with ECG of 17-Aug-2022 18:14, No significant change since last tracing Suspect that ST-T abnormality is Early repolarization pattern Confirmed by Bryan Lemma ([id number]) on 06/26/2023 9:35:45 AM - He had inferolateral T wave abnormality dating back to at least 09/26/06.    CV: Echo 02/06/16 [facility]): Findings  Mitral Valve  Normal mitral valve structure and mobility. No significant regurgitation.  Aortic Valve  The aortic valve leaflets were not well visualized.  No aortic stenosis. No aortic regurgitation.  Tricuspid Valve  Structurally normal tricuspid valve with no stenosis.  Pulmonic Valve  The pulmonic valve was not well visualized  No Doppler evidence of pulmonic stenosis or insufficiency.  Left Atrium  Normal left atrium.  Left Ventricle  The left ventricle was not well visualized, but overall grossly normal size  and function.  Ejection fraction is visually estimated at 55-60%  Right Atrium  Normal right atrium.  Right Ventricle  Normal right ventricle structure and function.  Pericardial Effusion  No evidence of pericardial effusion.  Miscellaneous  The aorta is within normal limits.  IVC not visualized.   Nuclear stress test 12/05/09: "Negative" per 01/01/21 endocrinology office note scanned under Media tab.  Cardiac MRI 09/29/06 ([facility]): "No evidence of HOCM" (by history).   Cardiac cath 09/28/06 [facility]): "Normal" per 01/01/21 endocrinology office note scanned under Media tab.  US Carotid 09/27/06 ([facility], scanned under Media tab):  IMPRESSION: 1-39%  bilateral ICA stenosis. Antegrade vertebral artery flow bilaterally. Subclavian flows are multiphasic. Mild bilateral ECA stenosis.    Past Medical History:  Diagnosis Date   Anemia    juvenile hemoglobin (HbF) trace; A1c is not reliable per pt.   Anxiety    Bacteriuria 07/06/2023   Colon polyp    Diabetes mellitus without complication (HCC)    GERD (gastroesophageal reflux disease)    Hx of colonoscopy 2025   Hypercholesteremia    Hypertension    Hypothyroidism    Kidney stone    Migraines    Pneumonia 2020   related to Covid   Prostatitis    Urgency of micturition 07/06/2023    Past Surgical History:  Procedure Laterality Date   CARDIAC CATHETERIZATION  09/28/2006   [facility]: (Dr. Rhona Leavens) no significant disease.  Similar to cardiac catheterization in the 1990s (1995)   Cardiac MRI  09/29/2006   [facility]: No evidence of HOCM   Carotid Dopplers  09/27/2006   1-39% stenosis of bilateral ICA.  Antegrade flow in bilateral vertebral arteries multiphasic subclavian artery flow and mild bilateral ECA stenosis.   EYE SURGERY Bilateral 2021   cataract removal   THYROIDECTOMY  2017   TRANSTHORACIC ECHOCARDIOGRAM  09/28/2006   [facility]: Mild to moderate LVH.  Hyperdynamic LV with EF 75 to 80%.  Normal aortic and mitral valves.  Right and left atria normal size.    MEDICATIONS:  acetaminophen (TYLENOL) 325 MG tablet  alfuzosin (UROXATRAL) 10 MG 24 hr tablet   amLODipine (NORVASC) 5 MG tablet   Ascorbic Acid (VITAMIN C) 1000 MG tablet   aspirin EC 81 MG tablet   atorvastatin (LIPITOR) 20 MG tablet   cloNIDine (CATAPRES) 0.2 MG tablet   cyanocobalamin (VITAMIN B12) 1000 MCG tablet   EMGALITY 120 MG/ML SOAJ   levothyroxine (SYNTHROID) 150 MCG tablet   Polyethyl Glyc-Propyl Glyc PF (SYSTANE ULTRA PF) 0.4-0.3 % SOLN   Polyethyl Glycol-Propyl Glycol (SYSTANE) 0.4-0.3 % GEL ophthalmic gel   potassium chloride SA (KLOR-CON M) 20 MEQ tablet   Propylene  Glycol, PF, (SYSTANE COMPLETE PF) 0.6 % SOLN   Rimegepant Sulfate 75 MG TBDP   tirzepatide (MOUNJARO) 10 MG/0.5ML Pen   Vibegron (GEMTESA) 75 MG TABS   No current facility-administered medications for this encounter.    Shonna Chock, PA-C Surgical Short Stay/Anesthesiology [facility] Phone [phone] [facility] Phone [phone] 09/02/2023 11:43 PM

## 2023-09-02 NOTE — Anesthesia Preprocedure Evaluation (Addendum)
 Anesthesia Evaluation  Patient identified by MRN, date of birth, ID band Patient awake    Reviewed: Allergy & Precautions, NPO status , Patient's Chart, lab work & pertinent test results  Airway Mallampati: II  TM Distance: >3 FB Neck ROM: Full    Dental no notable dental hx.    Pulmonary sleep apnea    Pulmonary exam normal        Cardiovascular hypertension,  Rhythm:Regular Rate:Normal     Neuro/Psych  Headaches  Anxiety        GI/Hepatic Neg liver ROS,GERD  ,,  Endo/Other  diabetes    Renal/GU   negative genitourinary   Musculoskeletal   Abdominal Normal abdominal exam  (+)   Peds  Hematology  (+) Blood dyscrasia, anemia Lab Results      Component                Value               Date                      WBC                      3.6 (L)             09/01/2023                HGB                      14.0                09/01/2023                HCT                      42.3                09/01/2023                MCV                      82.9                09/01/2023                PLT                      203                 09/01/2023              Anesthesia Other Findings   Reproductive/Obstetrics                             Anesthesia Physical Anesthesia Plan  ASA: 2  Anesthesia Plan: General   Post-op Pain Management: Celebrex PO (pre-op)*, Tylenol PO (pre-op)* and Gabapentin PO (pre-op)*   Induction: Intravenous  PONV Risk Score and Plan: 2 and Ondansetron, Dexamethasone, Midazolam and Treatment may vary due to age or medical condition  Airway Management Planned: Mask and Oral ETT  Additional Equipment: None  Intra-op Plan:   Post-operative Plan: Extubation in OR  Informed Consent: I have reviewed the patients History and Physical, chart, labs and discussed the procedure including the risks, benefits and alternatives for the proposed anesthesia with the patient  or authorized representative who  has indicated his/her understanding and acceptance.     Dental advisory given  Plan Discussed with: CRNA  Anesthesia Plan Comments: (PAT note written 09/02/2023 by Allison Zelenak, PA-C.  )       Anesthesia Quick Evaluation

## 2023-09-09 ENCOUNTER — Ambulatory Visit: Payer: Self-pay | Admitting: General Surgery

## 2023-09-09 NOTE — H&P (Signed)
 HPI  Carlos Shiflet Sr. is an 58 y.o. 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  Patient states that he has had issues with his prostate and difficulty urinating as well as incontinence for quite some time, worsened since this summer. Was found to have elevated PSA and subsequent workup for prostate cancer which included MRI. MR in December showed abnormally thickened appendix to 1.4cm--differential included appendicitis versus appendiceal mucocele or appendiceal tumor.   He states he has had GI issues most of his life with constipation. Usually sits on toilet for prolonged period of time. Occasionally notices BRBPR but has known hemorrhoids which this has previously been attributed to. States last colonoscopy was ~ 5 years ago and showed benign polyps.    Underwent prostate biopsy recently and awaiting results. He denies fevers/chills, denies abdominal pain, no nausea or emesis. No changes in his bowel habits from those described above.  10 point review of systems is negative except as listed above in HPI.  Objective  Past Medical History: Past Medical History:  Diagnosis Date   Anemia    juvenile hemoglobin (HbF) trace; A1c is not reliable per pt.   Anxiety    Bacteriuria 07/06/2023   Colon polyp    Diabetes mellitus without complication (HCC)    GERD (gastroesophageal reflux disease)    Hx of colonoscopy 2025   Hypercholesteremia    Hypertension    Hypothyroidism    Kidney stone    Migraines    Pneumonia 2020   related to Covid   Prostatitis    Urgency of micturition 07/06/2023    Past Surgical History: Past Surgical History:  Procedure Laterality Date   CARDIAC CATHETERIZATION  09/28/2006   [facility]: (Dr. Rhona Leavens) no significant disease.  Similar to cardiac catheterization in the 1990s (1995)   Cardiac MRI  09/29/2006   [facility]: No evidence of HOCM   Carotid Dopplers  09/27/2006   1-39% stenosis of bilateral ICA.   Antegrade flow in bilateral vertebral arteries multiphasic subclavian artery flow and mild bilateral ECA stenosis.   EYE SURGERY Bilateral 2021   cataract removal   THYROIDECTOMY  2017   TRANSTHORACIC ECHOCARDIOGRAM  09/28/2006   [facility]: Mild to moderate LVH.  Hyperdynamic LV with EF 75 to 80%.  Normal aortic and mitral valves.  Right and left atria normal size.    Family History:  Family History  Problem Relation Age of Onset   Brain cancer Mother    Diabetes Father    Stroke Father     Social History:  reports that he has never smoked. He has never used smokeless tobacco. He reports that he does not currently use alcohol. He reports that he does not use drugs.  Allergies:  Allergies  Allergen Reactions   Sulfur Dioxide Hives   Amoxicillin     "GI distress"   Benicar [Olmesartan] Swelling    Significant angioedema with respiratory difficulty   Dapagliflozin Other (See Comments)    Farxiga/ Yeast infection   Metformin And Related Diarrhea   Sulfa Antibiotics     Hives     Medications: I have reviewed the patient's current medications.  Labs: Pertinent lab work personally reviewed.  Imaging: Pertinent imaging personally reviewed MR 12/31: Abnormal wall thickening of a tubular right lower quadrant structure probably representing the appendix, up to 1.4 cm diameter, with accentuated enhancement. It is conceivable this is a small bowel loop instead but I favor this being  a thickened appendix as it seems to end blindly. Possibilities might include appendicitis, appendiceal mucocele, or appendiceal tumor.  Physical Exam There were no vitals taken for this visit. General: No acute distress, well appearing HEENT: PERRL, hearing grossly normal, mucous membranes moist CV: Regular rate and rhythm Pulm: Normal work of breathing on room air Abd: Soft, nontender, nondistended Extremities: Warm and well perfused Neuro: A&O x4, no focal neurologic deficits Psych:  Anxious    Assessment   Carlos Cousineau Sr. is an 58 y.o. male who presents today for laparoscopic appendectomy for appendicular mucocele  Plan  - Proceed to OR for laparoscopic appendectomy - The pathophysiology of appendicitis was discussed with the patient.  Natural history risks without surgery was discussed.   I feel the risks of no intervention outweigh the operative risks; therefore, I recommended diagnostic laparoscopy with appendectomy.  Laparoscopic & open techniques were discussed. Risks including but not limited to: bleeding, infection, abscess, leak, reoperation, injury to other organs, need for repair of tissues / organs, possible ileocecectomy, possible conversion to open operation, possible need for drain, hernia, and post-op ileus were discussed. Goals of post-operative recovery were discussed as well.  Questions were answered.  The patient expresses understanding & wishes to proceed with surgery.    Freddrick Jaffe, MD [facility]

## 2023-09-09 NOTE — H&P (View-Only) (Signed)
 HPI  Carlos Shiflet Sr. is an 58 y.o. 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  Patient states that he has had issues with his prostate and difficulty urinating as well as incontinence for quite some time, worsened since this summer. Was found to have elevated PSA and subsequent workup for prostate cancer which included MRI. MR in December showed abnormally thickened appendix to 1.4cm--differential included appendicitis versus appendiceal mucocele or appendiceal tumor.   He states he has had GI issues most of his life with constipation. Usually sits on toilet for prolonged period of time. Occasionally notices BRBPR but has known hemorrhoids which this has previously been attributed to. States last colonoscopy was ~ 5 years ago and showed benign polyps.    Underwent prostate biopsy recently and awaiting results. He denies fevers/chills, denies abdominal pain, no nausea or emesis. No changes in his bowel habits from those described above.  10 point review of systems is negative except as listed above in HPI.  Objective  Past Medical History: Past Medical History:  Diagnosis Date   Anemia    juvenile hemoglobin (HbF) trace; A1c is not reliable per pt.   Anxiety    Bacteriuria 07/06/2023   Colon polyp    Diabetes mellitus without complication (HCC)    GERD (gastroesophageal reflux disease)    Hx of colonoscopy 2025   Hypercholesteremia    Hypertension    Hypothyroidism    Kidney stone    Migraines    Pneumonia 2020   related to Covid   Prostatitis    Urgency of micturition 07/06/2023    Past Surgical History: Past Surgical History:  Procedure Laterality Date   CARDIAC CATHETERIZATION  09/28/2006   [facility]: (Dr. Rhona Leavens) no significant disease.  Similar to cardiac catheterization in the 1990s (1995)   Cardiac MRI  09/29/2006   [facility]: No evidence of HOCM   Carotid Dopplers  09/27/2006   1-39% stenosis of bilateral ICA.   Antegrade flow in bilateral vertebral arteries multiphasic subclavian artery flow and mild bilateral ECA stenosis.   EYE SURGERY Bilateral 2021   cataract removal   THYROIDECTOMY  2017   TRANSTHORACIC ECHOCARDIOGRAM  09/28/2006   [facility]: Mild to moderate LVH.  Hyperdynamic LV with EF 75 to 80%.  Normal aortic and mitral valves.  Right and left atria normal size.    Family History:  Family History  Problem Relation Age of Onset   Brain cancer Mother    Diabetes Father    Stroke Father     Social History:  reports that he has never smoked. He has never used smokeless tobacco. He reports that he does not currently use alcohol. He reports that he does not use drugs.  Allergies:  Allergies  Allergen Reactions   Sulfur Dioxide Hives   Amoxicillin     "GI distress"   Benicar [Olmesartan] Swelling    Significant angioedema with respiratory difficulty   Dapagliflozin Other (See Comments)    Farxiga/ Yeast infection   Metformin And Related Diarrhea   Sulfa Antibiotics     Hives     Medications: I have reviewed the patient's current medications.  Labs: Pertinent lab work personally reviewed.  Imaging: Pertinent imaging personally reviewed MR 12/31: Abnormal wall thickening of a tubular right lower quadrant structure probably representing the appendix, up to 1.4 cm diameter, with accentuated enhancement. It is conceivable this is a small bowel loop instead but I favor this being  a thickened appendix as it seems to end blindly. Possibilities might include appendicitis, appendiceal mucocele, or appendiceal tumor.  Physical Exam There were no vitals taken for this visit. General: No acute distress, well appearing HEENT: PERRL, hearing grossly normal, mucous membranes moist CV: Regular rate and rhythm Pulm: Normal work of breathing on room air Abd: Soft, nontender, nondistended Extremities: Warm and well perfused Neuro: A&O x4, no focal neurologic deficits Psych:  Anxious    Assessment   Carlos Mcclune Sr. is an 59 y.o. male who presents today for laparoscopic appendectomy for appendicular mucocele  Plan  - Proceed to OR for laparoscopic appendectomy - The pathophysiology of appendicitis was discussed with the patient.  Natural history risks without surgery was discussed.   I feel the risks of no intervention outweigh the operative risks; therefore, I recommended diagnostic laparoscopy with appendectomy.  Laparoscopic & open techniques were discussed. Risks including but not limited to: bleeding, infection, abscess, leak, reoperation, injury to other organs, need for repair of tissues / organs, possible ileocecectomy, possible conversion to open operation, possible need for drain, hernia, and post-op ileus were discussed. Goals of post-operative recovery were discussed as well.  Questions were answered.  The patient expresses understanding & wishes to proceed with surgery.    Donata Duff, MD [facility]

## 2023-09-10 ENCOUNTER — Other Ambulatory Visit: Payer: Self-pay

## 2023-09-10 ENCOUNTER — Encounter (HOSPITAL_COMMUNITY)
Admission: RE | Disposition: A | Discharge status: Home / Self Care | Payer: Self-pay | Source: Home / Self Care | Attending: General Surgery

## 2023-09-10 ENCOUNTER — Encounter (HOSPITAL_COMMUNITY): Payer: Self-pay | Admitting: General Surgery

## 2023-09-10 ENCOUNTER — Ambulatory Visit (HOSPITAL_COMMUNITY): Admitting: Anesthesiology

## 2023-09-10 ENCOUNTER — Ambulatory Visit (HOSPITAL_COMMUNITY): Payer: Self-pay | Admitting: Vascular Surgery

## 2023-09-10 ENCOUNTER — Ambulatory Visit (HOSPITAL_COMMUNITY)
Admission: RE | Admit: 2023-09-10 | Discharge: 2023-09-10 | Disposition: A | Discharge status: Home / Self Care | Payer: BC Managed Care – PPO | Attending: General Surgery | Admitting: General Surgery

## 2023-09-10 DIAGNOSIS — K388 Other specified diseases of appendix: Secondary | ICD-10-CM | POA: Insufficient documentation

## 2023-09-10 DIAGNOSIS — I10 Essential (primary) hypertension: Secondary | ICD-10-CM | POA: Diagnosis not present

## 2023-09-10 DIAGNOSIS — R972 Elevated prostate specific antigen [PSA]: Secondary | ICD-10-CM | POA: Insufficient documentation

## 2023-09-10 DIAGNOSIS — K5900 Constipation, unspecified: Secondary | ICD-10-CM | POA: Diagnosis not present

## 2023-09-10 DIAGNOSIS — E119 Type 2 diabetes mellitus without complications: Secondary | ICD-10-CM | POA: Insufficient documentation

## 2023-09-10 DIAGNOSIS — R32 Unspecified urinary incontinence: Secondary | ICD-10-CM | POA: Diagnosis not present

## 2023-09-10 HISTORY — PX: LAPAROSCOPIC APPENDECTOMY: SHX408

## 2023-09-10 LAB — GLUCOSE, CAPILLARY
Glucose-Capillary: 101 mg/dL — ABNORMAL HIGH (ref 70–99)
Glucose-Capillary: 135 mg/dL — ABNORMAL HIGH (ref 70–99)

## 2023-09-10 SURGERY — APPENDECTOMY, LAPAROSCOPIC
Anesthesia: General

## 2023-09-10 MED ORDER — OXYCODONE HCL 5 MG/5ML PO SOLN
ORAL | Status: AC
  Filled 2023-09-10: qty 5

## 2023-09-10 MED ORDER — BUPIVACAINE-EPINEPHRINE 0.25% -1:200000 IJ SOLN
INTRAMUSCULAR | Status: DC | PRN
  Administered 2023-09-10: 30 mL

## 2023-09-10 MED ORDER — FENTANYL CITRATE (PF) 250 MCG/5ML IJ SOLN
INTRAMUSCULAR | Status: DC | PRN
  Administered 2023-09-10 (×3): 50 ug via INTRAVENOUS

## 2023-09-10 MED ORDER — SUGAMMADEX SODIUM 200 MG/2ML IV SOLN
INTRAVENOUS | Status: DC | PRN
  Administered 2023-09-10: 250 mg via INTRAVENOUS

## 2023-09-10 MED ORDER — MIDAZOLAM HCL 2 MG/2ML IJ SOLN
INTRAMUSCULAR | Status: AC
  Filled 2023-09-10: qty 2

## 2023-09-10 MED ORDER — PROPOFOL 10 MG/ML IV BOLUS
INTRAVENOUS | Status: AC
  Filled 2023-09-10: qty 20

## 2023-09-10 MED ORDER — ROCURONIUM BROMIDE 10 MG/ML (PF) SYRINGE
PREFILLED_SYRINGE | INTRAVENOUS | Status: DC | PRN
  Administered 2023-09-10: 70 mg via INTRAVENOUS

## 2023-09-10 MED ORDER — ORAL CARE MOUTH RINSE: 15.0000 mL | Freq: Once | OROMUCOSAL | Status: AC

## 2023-09-10 MED ORDER — INSULIN ASPART 100 UNIT/ML IJ SOLN: 0.0000 [IU] | INTRAMUSCULAR | Status: DC | PRN

## 2023-09-10 MED ORDER — HEPARIN SODIUM (PORCINE) 5000 UNIT/ML IJ SOLN
5000.0000 [IU] | Freq: Once | INTRAMUSCULAR | Status: AC
  Administered 2023-09-10: 5000 [IU] via SUBCUTANEOUS
  Filled 2023-09-10: qty 1

## 2023-09-10 MED ORDER — CEFAZOLIN SODIUM-DEXTROSE 2-4 GM/100ML-% IV SOLN
2.0000 g | INTRAVENOUS | Status: AC
  Administered 2023-09-10: 2 g via INTRAVENOUS
  Filled 2023-09-10: qty 100

## 2023-09-10 MED ORDER — ACETAMINOPHEN 500 MG PO TABS
1000.0000 mg | ORAL_TABLET | ORAL | Status: AC
  Administered 2023-09-10: 1000 mg via ORAL
  Filled 2023-09-10: qty 2

## 2023-09-10 MED ORDER — CHLORHEXIDINE GLUCONATE CLOTH 2 % EX PADS: 6.0000 | MEDICATED_PAD | Freq: Once | CUTANEOUS | Status: DC

## 2023-09-10 MED ORDER — CHLORHEXIDINE GLUCONATE 0.12 % MT SOLN
15.0000 mL | Freq: Once | OROMUCOSAL | Status: AC
  Administered 2023-09-10: 15 mL via OROMUCOSAL
  Filled 2023-09-10: qty 15

## 2023-09-10 MED ORDER — OXYCODONE HCL 5 MG/5ML PO SOLN
5.0000 mg | Freq: Once | ORAL | Status: AC
  Administered 2023-09-10: 5 mg via ORAL

## 2023-09-10 MED ORDER — LIDOCAINE 2% (20 MG/ML) 5 ML SYRINGE
INTRAMUSCULAR | Status: DC | PRN
  Administered 2023-09-10: 100 mg via INTRAVENOUS

## 2023-09-10 MED ORDER — FENTANYL CITRATE (PF) 250 MCG/5ML IJ SOLN
INTRAMUSCULAR | Status: AC
  Filled 2023-09-10: qty 5

## 2023-09-10 MED ORDER — MIDAZOLAM HCL 2 MG/2ML IJ SOLN
INTRAMUSCULAR | Status: DC | PRN
  Administered 2023-09-10: 2 mg via INTRAVENOUS

## 2023-09-10 MED ORDER — LIDOCAINE 2% (20 MG/ML) 5 ML SYRINGE
INTRAMUSCULAR | Status: AC
  Filled 2023-09-10: qty 5

## 2023-09-10 MED ORDER — EPHEDRINE SULFATE-NACL 50-0.9 MG/10ML-% IV SOSY
PREFILLED_SYRINGE | INTRAVENOUS | Status: DC | PRN
  Administered 2023-09-10 (×4): 5 mg via INTRAVENOUS

## 2023-09-10 MED ORDER — PHENYLEPHRINE 80 MCG/ML (10ML) SYRINGE FOR IV PUSH (FOR BLOOD PRESSURE SUPPORT)
PREFILLED_SYRINGE | INTRAVENOUS | Status: DC | PRN
  Administered 2023-09-10 (×6): 80 ug via INTRAVENOUS

## 2023-09-10 MED ORDER — FENTANYL CITRATE (PF) 100 MCG/2ML IJ SOLN
INTRAMUSCULAR | Status: AC
  Filled 2023-09-10: qty 2

## 2023-09-10 MED ORDER — ROCURONIUM BROMIDE 10 MG/ML (PF) SYRINGE
PREFILLED_SYRINGE | INTRAVENOUS | Status: AC
  Filled 2023-09-10: qty 10

## 2023-09-10 MED ORDER — ONDANSETRON HCL 4 MG/2ML IJ SOLN
INTRAMUSCULAR | Status: DC | PRN
  Administered 2023-09-10: 4 mg via INTRAVENOUS

## 2023-09-10 MED ORDER — PROPOFOL 10 MG/ML IV BOLUS
INTRAVENOUS | Status: DC | PRN
  Administered 2023-09-10: 50 mg via INTRAVENOUS
  Administered 2023-09-10: 250 mg via INTRAVENOUS

## 2023-09-10 MED ORDER — LACTATED RINGERS IV SOLN: INTRAVENOUS | Status: DC

## 2023-09-10 MED ORDER — BUPIVACAINE-EPINEPHRINE (PF) 0.25% -1:200000 IJ SOLN
INTRAMUSCULAR | Status: AC
  Filled 2023-09-10: qty 30

## 2023-09-10 MED ORDER — DEXAMETHASONE SODIUM PHOSPHATE 10 MG/ML IJ SOLN
INTRAMUSCULAR | Status: DC | PRN
  Administered 2023-09-10: 10 mg via INTRAVENOUS

## 2023-09-10 MED ORDER — FENTANYL CITRATE (PF) 100 MCG/2ML IJ SOLN
25.0000 ug | INTRAMUSCULAR | Status: DC | PRN
  Administered 2023-09-10: 50 ug via INTRAVENOUS
  Administered 2023-09-10 (×2): 25 ug via INTRAVENOUS
  Administered 2023-09-10: 50 ug via INTRAVENOUS

## 2023-09-10 MED ORDER — GABAPENTIN 300 MG PO CAPS
300.0000 mg | ORAL_CAPSULE | ORAL | Status: AC
  Administered 2023-09-10: 300 mg via ORAL
  Filled 2023-09-10: qty 1

## 2023-09-10 MED ORDER — CELECOXIB 200 MG PO CAPS
200.0000 mg | ORAL_CAPSULE | ORAL | Status: AC
  Administered 2023-09-10: 200 mg via ORAL
  Filled 2023-09-10: qty 1

## 2023-09-10 SURGICAL SUPPLY — 42 items
BAG COUNTER SPONGE SURGICOUNT (BAG) ×2 IMPLANT
BLADE CLIPPER SURG (BLADE) IMPLANT
CANISTER SUCT 3000ML PPV (MISCELLANEOUS) ×2 IMPLANT
CHLORAPREP W/TINT 26 (MISCELLANEOUS) ×2 IMPLANT
COVER SURGICAL LIGHT HANDLE (MISCELLANEOUS) ×2 IMPLANT
CUTTER FLEX LINEAR 45M (STAPLE) ×2 IMPLANT
DERMABOND ADVANCED .7 DNX12 (GAUZE/BANDAGES/DRESSINGS) ×2 IMPLANT
ELECT REM PT RETURN 9FT ADLT (ELECTROSURGICAL) ×1 IMPLANT
ELECTRODE REM PT RTRN 9FT ADLT (ELECTROSURGICAL) ×2 IMPLANT
GLOVE BIOGEL PI MICRO STRL 6 (GLOVE) ×2 IMPLANT
GLOVE INDICATOR 6.5 STRL GRN (GLOVE) ×2 IMPLANT
GOWN STRL REUS W/ TWL LRG LVL3 (GOWN DISPOSABLE) ×6 IMPLANT
GRASPER SUT TROCAR 14GX15 (MISCELLANEOUS) ×2 IMPLANT
IRRIG SUCT STRYKERFLOW 2 WTIP (MISCELLANEOUS) ×1 IMPLANT
IRRIGATION SUCT STRKRFLW 2 WTP (MISCELLANEOUS) ×2 IMPLANT
KIT BASIN OR (CUSTOM PROCEDURE TRAY) ×2 IMPLANT
KIT TURNOVER KIT B (KITS) ×2 IMPLANT
L-HOOK LAP DISP 36CM (ELECTROSURGICAL) ×1 IMPLANT
LHOOK LAP DISP 36CM (ELECTROSURGICAL) ×2 IMPLANT
NDL INSUFFLATION 14GA 120MM (NEEDLE) ×2 IMPLANT
NEEDLE INSUFFLATION 14GA 120MM (NEEDLE) ×2 IMPLANT
NS IRRIG 1000ML POUR BTL (IV SOLUTION) ×2 IMPLANT
PAD ARMBOARD POSITIONER FOAM (MISCELLANEOUS) ×4 IMPLANT
PENCIL BUTTON HOLSTER BLD 10FT (ELECTRODE) ×2 IMPLANT
POUCH LAPAROSCOPIC INSTRUMENT (MISCELLANEOUS) ×2 IMPLANT
RELOAD 45 VASCULAR/THIN (ENDOMECHANICALS) ×2 IMPLANT
RELOAD STAPLE 45 2.5 WHT GRN (ENDOMECHANICALS) IMPLANT
RELOAD STAPLE 45 3.5 BLU ETS (ENDOMECHANICALS) ×4 IMPLANT
RELOAD STAPLE TA45 3.5 REG BLU (ENDOMECHANICALS) ×3 IMPLANT
SCISSORS LAP 5X35 DISP (ENDOMECHANICALS) IMPLANT
SET TUBE SMOKE EVAC HIGH FLOW (TUBING) ×2 IMPLANT
SLEEVE Z-THREAD 5X100MM (TROCAR) ×2 IMPLANT
SUT MNCRL AB 4-0 PS2 18 (SUTURE) ×2 IMPLANT
SYS BAG RETRIEVAL 10MM (BASKET) ×1 IMPLANT
SYSTEM BAG RETRIEVAL 10MM (BASKET) ×2 IMPLANT
TOWEL GREEN STERILE FF (TOWEL DISPOSABLE) ×2 IMPLANT
TRAY FOLEY W/BAG SLVR 16FR ST (SET/KITS/TRAYS/PACK) ×2 IMPLANT
TRAY LAPAROSCOPIC MC (CUSTOM PROCEDURE TRAY) ×2 IMPLANT
TROCAR Z THREAD OPTICAL 12X100 (TROCAR) ×2 IMPLANT
TROCAR Z-THREAD OPTICAL 5X100M (TROCAR) ×2 IMPLANT
WARMER LAPAROSCOPE (MISCELLANEOUS) ×2 IMPLANT
WATER STERILE IRR 1000ML POUR (IV SOLUTION) ×2 IMPLANT

## 2023-09-10 NOTE — Transfer of Care (Addendum)
 Immediate Anesthesia Transfer of Care Note  Patient: Carlos Wussow Sr.  Procedure(s) Performed: LAPAROSCOPIC APPENDECTOMY  Patient Location: PACU  Anesthesia Type:General  Level of Consciousness: awake  Airway & Oxygen Therapy: Patient connected to face mask oxygen  Post-op Assessment: Post -op Vital signs reviewed and stable  Post vital signs: stable  Last Vitals:  Vitals Value Taken Time  BP 116/73 09/10/23 1410  Temp    Pulse 65 09/10/23 1411  Resp 12 09/10/23 1411  SpO2 96 % 09/10/23 1411  Vitals shown include unfiled device data.  Last Pain:  Vitals:   09/10/23 1200  TempSrc:   PainSc: 0-No pain         Complications: No notable events documented.

## 2023-09-10 NOTE — Interval H&P Note (Signed)
 History and Physical Interval Note:  09/10/2023 11:43 AM  Carlos Whitehead Sr.  has presented today for surgery, with the diagnosis of thickened appendix.  The various methods of treatment have been discussed with the patient and family. After consideration of risks, benefits and other options for treatment, the patient has consented to  Procedure(s): LAPAROSCOPIC APPENDECTOMY (N/A) as a surgical intervention.  The patient's history has been reviewed, patient examined, no change in status, stable for surgery.  I have reviewed the patient's chart and labs.  Questions were answered to the patient's satisfaction.     Edmon Gosling, MD

## 2023-09-10 NOTE — Anesthesia Postprocedure Evaluation (Signed)
 Anesthesia Post Note  Patient: Carlos Melnik Sr.  Procedure(s) Performed: LAPAROSCOPIC APPENDECTOMY     Patient location during evaluation: PACU Anesthesia Type: General Level of consciousness: awake and alert Pain management: pain level controlled Vital Signs Assessment: post-procedure vital signs reviewed and stable Respiratory status: spontaneous breathing, nonlabored ventilation, respiratory function stable and patient connected to nasal cannula oxygen Cardiovascular status: blood pressure returned to baseline and stable Postop Assessment: no apparent nausea or vomiting Anesthetic complications: no   There were no known notable events for this encounter.  Last Vitals:  Vitals:   09/10/23 1515 09/10/23 1530  BP: 111/64 112/65  Pulse: (!) 52 (!) 53  Resp: (!) 9 14  Temp:  (!) 36.3 C  SpO2: 100% 97%    Last Pain:  Vitals:   09/10/23 1530  TempSrc:   PainSc: 4                  Carlos Greene

## 2023-09-10 NOTE — Anesthesia Procedure Notes (Addendum)
 Procedure Name: Intubation Date/Time: 09/10/2023 12:57 PM  Performed by: Claybon Cuna, CRNAPre-anesthesia Checklist: Patient identified, Emergency Drugs available, Suction available and Patient being monitored Patient Re-evaluated:Patient Re-evaluated prior to induction Oxygen Delivery Method: Circle System Utilized Preoxygenation: Pre-oxygenation with 100% oxygen Induction Type: IV induction Ventilation: Mask ventilation without difficulty Laryngoscope Size: Mac and 4 Grade View: Grade I Tube type: Oral Tube size: 7.5 mm Number of attempts: 1 Airway Equipment and Method: Stylet and Oral airway Placement Confirmation: ETT inserted through vocal cords under direct vision, positive ETCO2 and breath sounds checked- equal and bilateral Secured at: 22 cm Tube secured with: Tape Dental Injury: Teeth and Oropharynx as per pre-operative assessment

## 2023-09-10 NOTE — Op Note (Signed)
 Date of Surgery: 09/10/2023 Admit Date: 09/10/2023 Performing Service: General Surgeons and Role:    Edmon Gosling, MD - Primary  Preoperative Diagnosis: Appendiceal mucocele  Postoperative Diagnosis: Appendiceal mucocele  Procedure(s) Performed:  - Diagnostic laparoscopy - Laparoscopic appendectomy  Surgeon:    Freddrick Jaffe, MD  Anesthesia:    General endotracheal.  Specimens:  Appendix  Estimated Blood Loss:   Minimal.  Indications for Surgery:   This is a 58 y.o. male  with dilated appendix on imaging concerning for appendiceal mucocele  Operative Findings: elongated and slightly dilated appendix with no evidence of inflammation  Procedure:  After obtaining consent from the patient, the patient was taken to the operating room and laid supine on the operating table.  The patient was then placed under general endotracheal anesthesia.  Patient voided on call to OR.  The anterior abdominal wall was prepped and draped in the usual standard fashion.  A 5 mm infraumbilical incision was made. The umbilical stalk was grasped with a kocher and a Veress needle was successfully entered into the peritoneal cavity and the peritoneal cavity was insufflated with carbon dioxide gas.  Upon adequate achievement of pneumoperitoneum, a 5 mm trocar was introduced in optiview fashion using a 5mm 30-degree scope. Diagnostic laparoscopy was performed.  There was no evidence of injury.  There was evidence of inflammation in the right lower quadrant.  A suprapubic 5 mm trocar was then placed in the usual standard fashion under direct vision.  A left lower quadrant 12 mm trocar was then placed under direct vision.  The patient was placed in a head down, left side down position.  The cecum was identified; this was lifted up.  The terminal ileum was identified.  The appendix came into full view.  The appendix was then grasped and retracted in a cephalad manner.  The mesoappendix was then clearly visualized.  A  window was made in the mesentery at the base of the appendix.  A bowel load stapler was then fired across the base of the appendix.  A vascular load stapler was then fired across the mesoappendix. An additional vascular load was needed to completely staple across mesoappendix. The appendix was then placed into an Endocatch bag. We then turned our attention back to the staple lines and both appeared intact and hemostatic. The appendix was then removed through the 12 mm port site with the trocar. The 12 mm trocar site fascia was closed using 0 Vicryl on a suture passer under direct visualization. The suprapubic trocar site was removed under direct vision.  The abdomen was desufflated and periumbilical port site removed.   Skin of all trocar sites were closed using 4-0 Monocryl in a subcuticular manner and Dermabond.  The Foley catheter was removed.  The patient was reversed from general endotracheal anesthesia, extubated and sent to PACU in stable condition.  Instrument, sponge, and needle counts were correct at closure and at the conclusion of the case.    Freddrick Jaffe, MD [facility]

## 2023-09-11 ENCOUNTER — Encounter (HOSPITAL_COMMUNITY): Payer: Self-pay | Admitting: General Surgery

## 2023-09-11 LAB — SURGICAL PATHOLOGY

## 2023-09-13 ENCOUNTER — Telehealth: Payer: Self-pay | Admitting: General Surgery

## 2023-09-13 NOTE — Telephone Encounter (Signed)
 Patient called stating having poorly controlled pain at left lower quadrant port site. Feels like something is pulling when he moves. He has not taken much oxycodone , only 1-2 max per day alternating with tylenol . I discussed with patient taking tylenol  scheduled and taking the oxycodone  as frequently as every 4 hours. If 5mg  does not help pain can increased to 10mg  (2 tablets). Also recommended ice over area. He has not had bowel movements since Thursday. Recommended patient take miralax daily and can increase to 2-3x daily until he has bowel movement.  If pain not improved by tomorrow, he knows to call our clinic. May benefit from Rx for robaxin  but will try above first.  Carlos Jaffe, MD [facility]

## 2023-10-03 ENCOUNTER — Encounter: Payer: Self-pay | Admitting: Infectious Disease

## 2023-10-03 DIAGNOSIS — R300 Dysuria: Secondary | ICD-10-CM

## 2023-10-03 DIAGNOSIS — K388 Other specified diseases of appendix: Secondary | ICD-10-CM | POA: Insufficient documentation

## 2023-10-03 HISTORY — DX: Dysuria: R30.0

## 2023-10-03 HISTORY — DX: Other specified diseases of appendix: K38.8

## 2023-10-03 NOTE — Progress Notes (Deleted)
 Subjective:    Patient ID: Carlos Seed Sr., male    DOB: 1966/03/26, 58 y.o.   MRN: [id number]  HPI     Past Medical History:  Diagnosis Date   Anemia    juvenile hemoglobin (HbF) trace; A1c is not reliable per pt.   Anxiety    Bacteriuria 07/06/2023   Colon polyp    Diabetes mellitus without complication (HCC)    Dysuria 10/03/2023   GERD (gastroesophageal reflux disease)    Hx of colonoscopy 2025   Hypercholesteremia    Hypertension    Hypothyroidism    Kidney stone    Migraines    Pneumonia 2020   related to Covid   Prostatitis    Urgency of micturition 07/06/2023    Past Surgical History:  Procedure Laterality Date   CARDIAC CATHETERIZATION  09/28/2006   [facility]: (Dr. Joan Mouton) no significant disease.  Similar to cardiac catheterization in the 1990s (1995)   Cardiac MRI  09/29/2006   [facility]: No evidence of HOCM   Carotid Dopplers  09/27/2006   1-39% stenosis of bilateral ICA.  Antegrade flow in bilateral vertebral arteries multiphasic subclavian artery flow and mild bilateral ECA stenosis.   EYE SURGERY Bilateral 2021   cataract removal   LAPAROSCOPIC APPENDECTOMY N/A 09/10/2023   Procedure: LAPAROSCOPIC APPENDECTOMY;  Surgeon: Edmon Gosling, MD;  [facility];  Service: General;  Laterality: N/A;   THYROIDECTOMY  2017   TRANSTHORACIC ECHOCARDIOGRAM  09/28/2006   [facility]: Mild to moderate LVH.  Hyperdynamic LV with EF 75 to 80%.  Normal aortic and mitral valves.  Right and left atria normal size.    Family History  Problem Relation Age of Onset   Brain cancer Mother    Diabetes Father    Stroke Father       Social History   Socioeconomic History   Marital status: Married    Spouse name: Not on file   Number of children: Not on file   Years of education: Not on file   Highest education level: Not on file  Occupational History   Not on file  Tobacco Use   Smoking status: Never   Smokeless tobacco: Never   Vaping Use   Vaping status: Never Used  Substance and Sexual Activity   Alcohol use: Not Currently   Drug use: Never   Sexual activity: Not on file  Other Topics Concern   Not on file  Social History Narrative         5/3-11/2006: Admitted to [facility] for sudden onset syncope   A) Heart Cath-angiographically normal coronary arteries.   B) Cardiac MRI Negative for HOCM   C) 72-Hour Holter Monitor Negative       Telemetry showed sinus rhythm with 1  AV block and QRS is within large voltage and dramatic ST depressions.   EKG 09/26/2006: Sinus bradycardia-58 bpm.  ST and T wave abnormality was noted with biphasic ST segments in V2 and V3 with T wave inversions in I, early 02, aVL, V3 through V6.  Consistent with LVH repolarization abnormality, versus ischemia.   Social Drivers of Corporate investment banker Strain: Not on file  Food Insecurity: No Food Insecurity (07/05/2020)   Received from [facility]   Hunger Vital Sign    Worried About Running Out of Food in the Last Year: Never true    Ran Out of Food in the Last Year: Never true  Transportation Needs: Not on file  Physical Activity: Not on file  Stress: Not on file  Social Connections: Unknown (10/08/2021)   Received from [facility]   Social Network    Social Network: Not on file    Allergies  Allergen Reactions   Sulfur Dioxide Hives   Amoxicillin     "GI distress"   Benicar  [Olmesartan ] Swelling    Significant angioedema with respiratory difficulty   Dapagliflozin Other (See Comments)    Farxiga/ Yeast infection   Metformin And Related Diarrhea   Sulfa Antibiotics     Hives      Current Outpatient Medications:    acetaminophen  (TYLENOL ) 325 MG tablet, Take 650 mg by mouth every 6 (six) hours as needed for fever., Disp: , Rfl:    alfuzosin (UROXATRAL) 10 MG 24 hr tablet, Take 10 mg by mouth daily with breakfast., Disp: , Rfl:    amLODipine  (NORVASC ) 5  MG tablet, Take 2 tablets (10 mg total) by mouth daily., Disp: 60 tablet, Rfl: 0   Ascorbic Acid (VITAMIN C) 1000 MG tablet, Take 1,000 mg by mouth daily., Disp: , Rfl:    aspirin  EC 81 MG tablet, Take 81 mg by mouth in the morning., Disp: , Rfl:    atorvastatin  (LIPITOR) 20 MG tablet, Take 20 mg by mouth at bedtime., Disp: , Rfl:    cloNIDine (CATAPRES) 0.2 MG tablet, Take 0.2 mg by mouth 2 (two) times daily., Disp: , Rfl:    cyanocobalamin  (VITAMIN B12) 1000 MCG tablet, Take 1,000 mcg by mouth daily., Disp: , Rfl:    EMGALITY 120 MG/ML SOAJ, Inject 120 mg into the skin every 30 (thirty) days., Disp: , Rfl:    levothyroxine (SYNTHROID) 150 MCG tablet, Take 150 mcg by mouth daily before breakfast., Disp: , Rfl:    Polyethyl Glyc-Propyl Glyc PF (SYSTANE ULTRA PF) 0.4-0.3 % SOLN, Place 1 drop into both eyes at bedtime as needed (Dry eye)., Disp: , Rfl:    Polyethyl Glycol-Propyl Glycol (SYSTANE) 0.4-0.3 % GEL ophthalmic gel, Place 1 Application into both eyes at bedtime as needed (Dry eyes)., Disp: , Rfl:    potassium chloride  SA (KLOR-CON  M) 20 MEQ tablet, Take 1 tablet (20 mEq total) by mouth 2 (two) times daily for 10 days. (Patient taking differently: Take 8 mEq by mouth 2 (two) times daily.), Disp: 20 tablet, Rfl: 0   Propylene Glycol, PF, (SYSTANE COMPLETE PF) 0.6 % SOLN, Place 1 drop into both eyes daily as needed (dry eye)., Disp: , Rfl:    Rimegepant Sulfate 75 MG TBDP, Take 75 mg by mouth daily as needed (Migraine)., Disp: , Rfl:    tirzepatide  (MOUNJARO ) 10 MG/0.5ML Pen, Inject 10 mg into the skin once a week. (Patient taking differently: Inject 12.5 mg into the skin once a week.), Disp: 6 mL, Rfl: 4   Vibegron (GEMTESA) 75 MG TABS, Take 75 mg by mouth daily at 12 noon., Disp: , Rfl:     Review of Systems     Objective:   Physical Exam        Assessment & Plan:

## 2023-10-05 ENCOUNTER — Encounter: Payer: Self-pay | Admitting: Cardiology

## 2023-10-05 ENCOUNTER — Ambulatory Visit: Payer: BC Managed Care – PPO | Admitting: Infectious Disease

## 2023-10-05 DIAGNOSIS — K388 Other specified diseases of appendix: Secondary | ICD-10-CM

## 2023-10-05 DIAGNOSIS — R300 Dysuria: Secondary | ICD-10-CM

## 2023-10-05 DIAGNOSIS — R3915 Urgency of urination: Secondary | ICD-10-CM

## 2023-10-08 ENCOUNTER — Ambulatory Visit: Admitting: Infectious Disease

## 2023-11-10 NOTE — Progress Notes (Addendum)
 Subjective:  Chief complaint: follow-up for multiple urinary symptoms  Patient ID: Carlos Kerns Sr., male    DOB: 31-Mar-1966, 58 y.o.   MRN: [id number]  HPI  Discussed the use of AI scribe software for clinical note transcription with the patient, who gave verbal consent to proceed.  History of Present Illness   Carlos Greene Sr. is a 58 year old male with chronic urinary symptoms and an enlarged prostate who presents with frequent urination. He was referred by Dr. Carolee from urology for evaluation of his chronic urinary symptoms when I saw him in February of 2025.  He experiences frequent urination every twenty minutes, especially when not taking Gemtesa. Urination is completely without dysuria. Symptoms of polyuria were severe during a recent trip to [place] but resolved upon returning home.  He has a history of chronic urinary tract infections with pain and occasional hematuria. Cultures have frequently been positive.  He has an enlarged prostate, leading to various diagnostic procedures, including prostate exams, cystoscopies, and a biopsy, which ruled out prostate cancer.  In April, he underwent an appendectomy after imaging for prostate issues incidentally revealed an unhealthy appendix. The surgery was delayed due to COVID-19 exposure.  He has type 2 diabetes, with a recent A1c below 7, and has lost approximately 65 pounds over the past year and a half. He manages his diabetes with Mounjaro  and dietary changes.       Past Medical History:  Diagnosis Date   Anemia    juvenile hemoglobin (HbF) trace; A1c is not reliable per pt.   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 Cindy) no significant disease.  Similar to cardiac catheterization in the 1990s (1995)   Cardiac MRI  09/29/2006   [facility]: No evidence of HOCM   Carotid Dopplers  09/27/2006   1-39% stenosis of bilateral ICA.  Antegrade flow in bilateral vertebral arteries multiphasic subclavian artery flow and mild bilateral ECA stenosis.   EYE SURGERY Bilateral 2021   cataract removal   LAPAROSCOPIC APPENDECTOMY N/A 09/10/2023   Procedure: LAPAROSCOPIC APPENDECTOMY;  Surgeon: Ann Fine, MD;  Location: [facility] OR;  Service: General;  Laterality: N/A;   THYROIDECTOMY  2017   TRANSTHORACIC ECHOCARDIOGRAM  09/28/2006   [facility]: Mild to moderate LVH.  Hyperdynamic LV with EF 75 to 80%.  Normal aortic and mitral valves.  Right and left atria normal size.    Family History  Problem Relation Age of Onset   Brain cancer Mother    Diabetes Father    Stroke Father       Social History   Socioeconomic History   Marital status: Married    Spouse name: Not on file   Number of children: Not on file   Years of education: Not on file   Highest education level: Not on file  Occupational History   Not on file  Tobacco Use   Smoking status: Never  Smokeless tobacco: Never  Vaping Use   Vaping status: Never Used  Substance and Sexual Activity   Alcohol use: Not Currently   Drug use: Never   Sexual activity: Not on file  Other Topics Concern   Not on file  Social History Narrative         5/3-11/2006: Admitted to [facility] for sudden onset syncope   A) Heart Cath-angiographically normal coronary arteries.   B) Cardiac MRI Negative for HOCM   C) 72-Hour Holter Monitor Negative       Telemetry showed sinus rhythm with 1  AV block and QRS is within large voltage and dramatic ST  depressions.   EKG 09/26/2006: Sinus bradycardia-58 bpm.  ST and T wave abnormality was noted with biphasic ST segments in V2 and V3 with T wave inversions in I, early 02, aVL, V3 through V6.  Consistent with LVH repolarization abnormality, versus ischemia.   Social Drivers of Corporate Investment Banker Strain: Not on file  Food Insecurity: No Food Insecurity (07/05/2020)   Received from [facility]   Hunger Vital Sign    Within the past 12 months, you worried that your food would run out before you got the money to buy more.: Never true    Within the past 12 months, the food you bought just didn't last and you didn't have money to get more.: Never true  Transportation Needs: Not on file  Physical Activity: Not on file  Stress: Not on file  Social Connections: Unknown (10/08/2021)   Received from [facility]   Social Network    Social Network: Not on file    Allergies  Allergen Reactions   Sulfur Dioxide Hives   Amoxicillin     GI distress   Benicar  [Olmesartan ] Swelling    Significant angioedema with respiratory difficulty   Dapagliflozin Other (See Comments)    Farxiga/ Yeast infection   Metformin And Related Diarrhea   Sulfa Antibiotics     Hives      Current Outpatient Medications:    acetaminophen  (TYLENOL ) 325 MG tablet, Take 650 mg by mouth every 6 (six) hours as needed for fever., Disp: , Rfl:    alfuzosin (UROXATRAL) 10 MG 24 hr tablet, Take 10 mg by mouth daily with breakfast., Disp: , Rfl:    amLODipine  (NORVASC ) 5 MG tablet, Take 2 tablets (10 mg total) by mouth daily., Disp: 60 tablet, Rfl: 0   Ascorbic Acid (VITAMIN C) 1000 MG tablet, Take 1,000 mg by mouth daily., Disp: , Rfl:    aspirin  EC 81 MG tablet, Take 81 mg by mouth in the morning., Disp: , Rfl:    atorvastatin  (LIPITOR) 20 MG tablet, Take 20 mg by mouth at bedtime., Disp: , Rfl:    cloNIDine (CATAPRES) 0.2 MG tablet, Take 0.2 mg by mouth 2 (two) times daily., Disp: , Rfl:    cyanocobalamin   (VITAMIN B12) 1000 MCG tablet, Take 1,000 mcg by mouth daily., Disp: , Rfl:    EMGALITY 120 MG/ML SOAJ, Inject 120 mg into the skin every 30 (thirty) days., Disp: , Rfl:    levothyroxine (SYNTHROID) 150 MCG tablet, Take 150 mcg by mouth daily before breakfast., Disp: , Rfl:    Polyethyl Glyc-Propyl Glyc PF (SYSTANE ULTRA PF) 0.4-0.3 % SOLN, Place 1 drop into both eyes at bedtime as needed (Dry eye)., Disp: , Rfl:    Polyethyl Glycol-Propyl Glycol (SYSTANE) 0.4-0.3 % GEL ophthalmic gel, Place 1 Application into both eyes at bedtime  as needed (Dry eyes)., Disp: , Rfl:    potassium chloride  SA (KLOR-CON  M) 20 MEQ tablet, Take 1 tablet (20 mEq total) by mouth 2 (two) times daily for 10 days. (Patient taking differently: Take 8 mEq by mouth 2 (two) times daily.), Disp: 20 tablet, Rfl: 0   Propylene Glycol, PF, (SYSTANE COMPLETE PF) 0.6 % SOLN, Place 1 drop into both eyes daily as needed (dry eye)., Disp: , Rfl:    Rimegepant Sulfate 75 MG TBDP, Take 75 mg by mouth daily as needed (Migraine)., Disp: , Rfl:    tirzepatide  (MOUNJARO ) 10 MG/0.5ML Pen, Inject 10 mg into the skin once a week. (Patient taking differently: Inject 12.5 mg into the skin once a week.), Disp: 6 mL, Rfl: 4   Vibegron (GEMTESA) 75 MG TABS, Take 75 mg by mouth daily at 12 noon., Disp: , Rfl:    Past Medical History:  Diagnosis Date   Anemia    juvenile hemoglobin (HbF) trace; A1c is not reliable per pt.   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 Cindy) no  significant disease.  Similar to cardiac catheterization in the 1990s (1995)   Cardiac MRI  09/29/2006   [facility]: No evidence of HOCM   Carotid Dopplers  09/27/2006   1-39% stenosis of bilateral ICA.  Antegrade flow in bilateral vertebral arteries multiphasic subclavian artery flow and mild bilateral ECA stenosis.   EYE SURGERY Bilateral 2021   cataract removal   LAPAROSCOPIC APPENDECTOMY N/A 09/10/2023   Procedure: LAPAROSCOPIC APPENDECTOMY;  Surgeon: Ann Fine, MD;  Location: [facility] OR;  Service: General;  Laterality: N/A;   THYROIDECTOMY  2017   TRANSTHORACIC ECHOCARDIOGRAM  09/28/2006   [facility]: Mild to moderate LVH.  Hyperdynamic LV with EF 75 to 80%.  Normal aortic and mitral valves.  Right and left atria normal size.    Family History  Problem Relation Age of Onset   Brain cancer Mother    Diabetes Father    Stroke Father       Social History   Socioeconomic History   Marital status: Married    Spouse name: Not on file   Number of children: Not on file   Years of education: Not on file   Highest education level: Not on file  Occupational History   Not on file  Tobacco Use   Smoking status: Never   Smokeless tobacco: Never  Vaping Use   Vaping status: Never Used  Substance and Sexual Activity   Alcohol use: Not Currently   Drug use: Never   Sexual activity: Not on file  Other Topics Concern   Not on file  Social History Narrative         5/3-11/2006: Admitted to [facility] for sudden onset syncope   A) Heart Cath-angiographically normal coronary arteries.   B) Cardiac MRI Negative for HOCM   C) 72-Hour Holter Monitor Negative  Telemetry showed sinus rhythm with 1  AV block and QRS is within large voltage and dramatic ST depressions.   EKG 09/26/2006: Sinus bradycardia-58 bpm.  ST and T wave abnormality was noted with biphasic ST segments in V2 and V3 with T wave inversions in I, early 02, aVL, V3 through V6.   Consistent with LVH repolarization abnormality, versus ischemia.   Social Drivers of Corporate Investment Banker Strain: Not on file  Food Insecurity: No Food Insecurity (07/05/2020)   Received from [facility]   Hunger Vital Sign    Within the past 12 months, you worried that your food would run out before you got the money to buy more.: Never true    Within the past 12 months, the food you bought just didn't last and you didn't have money to get more.: Never true  Transportation Needs: Not on file  Physical Activity: Not on file  Stress: Not on file  Social Connections: Unknown (10/08/2021)   Received from [facility]   Social Network    Social Network: Not on file    Allergies  Allergen Reactions   Sulfur Dioxide Hives   Amoxicillin     GI distress   Benicar  [Olmesartan ] Swelling    Significant angioedema with respiratory difficulty   Dapagliflozin Other (See Comments)    Farxiga/ Yeast infection   Metformin And Related Diarrhea   Sulfa Antibiotics     Hives      Current Outpatient Medications:    acetaminophen  (TYLENOL ) 325 MG tablet, Take 650 mg by mouth every 6 (six) hours as needed for fever., Disp: , Rfl:    alfuzosin (UROXATRAL) 10 MG 24 hr tablet, Take 10 mg by mouth daily with breakfast., Disp: , Rfl:    amLODipine  (NORVASC ) 5 MG tablet, Take 2 tablets (10 mg total) by mouth daily., Disp: 60 tablet, Rfl: 0   Ascorbic Acid (VITAMIN C) 1000 MG tablet, Take 1,000 mg by mouth daily., Disp: , Rfl:    aspirin  EC 81 MG tablet, Take 81 mg by mouth in the morning., Disp: , Rfl:    atorvastatin  (LIPITOR) 20 MG tablet, Take 20 mg by mouth at bedtime., Disp: , Rfl:    cloNIDine (CATAPRES) 0.2 MG tablet, Take 0.2 mg by mouth 2 (two) times daily., Disp: , Rfl:    cyanocobalamin  (VITAMIN B12) 1000 MCG tablet, Take 1,000 mcg by mouth daily., Disp: , Rfl:    EMGALITY 120 MG/ML SOAJ, Inject 120 mg into the skin every 30 (thirty) days., Disp: , Rfl:    levothyroxine  (SYNTHROID) 150 MCG tablet, Take 150 mcg by mouth daily before breakfast., Disp: , Rfl:    Polyethyl Glyc-Propyl Glyc PF (SYSTANE ULTRA PF) 0.4-0.3 % SOLN, Place 1 drop into both eyes at bedtime as needed (Dry eye)., Disp: , Rfl:    Polyethyl Glycol-Propyl Glycol (SYSTANE) 0.4-0.3 % GEL ophthalmic gel, Place 1 Application into both eyes at bedtime as needed (Dry eyes)., Disp: , Rfl:    potassium chloride  SA (KLOR-CON  M) 20 MEQ tablet, Take 1 tablet (20 mEq total) by mouth 2 (two) times daily for 10 days. (Patient taking differently: Take 8 mEq by mouth 2 (two) times daily.), Disp: 20 tablet, Rfl: 0   Propylene Glycol, PF, (SYSTANE COMPLETE PF) 0.6 % SOLN, Place 1 drop into both eyes daily as needed (dry eye)., Disp: , Rfl:    Rimegepant Sulfate 75 MG TBDP, Take 75 mg by mouth daily as needed (Migraine)., Disp: , Rfl:  tirzepatide  (MOUNJARO ) 10 MG/0.5ML Pen, Inject 10 mg into the skin once a week. (Patient taking differently: Inject 12.5 mg into the skin once a week.), Disp: 6 mL, Rfl: 4   Vibegron (GEMTESA) 75 MG TABS, Take 75 mg by mouth daily at 12 noon., Disp: , Rfl:     Review of Systems  Constitutional:  Negative for activity change, appetite change, chills, diaphoresis, fatigue, fever and unexpected weight change.  HENT:  Negative for congestion, rhinorrhea, sinus pressure, sneezing, sore throat and trouble swallowing.   Eyes:  Negative for photophobia and visual disturbance.  Respiratory:  Negative for cough, chest tightness, shortness of breath, wheezing and stridor.   Cardiovascular:  Negative for chest pain, palpitations and leg swelling.  Gastrointestinal:  Negative for abdominal distention, abdominal pain, anal bleeding, blood in stool, constipation, diarrhea, nausea and vomiting.  Endocrine: Positive for polyuria. Negative for polydipsia and polyphagia.  Genitourinary:  Positive for frequency. Negative for difficulty urinating, dysuria, flank pain and hematuria.  Musculoskeletal:   Negative for arthralgias, back pain, gait problem, joint swelling and myalgias.  Skin:  Negative for color change, pallor, rash and wound.  Neurological:  Negative for dizziness, tremors, weakness and light-headedness.  Hematological:  Negative for adenopathy. Does not bruise/bleed easily.  Psychiatric/Behavioral:  Negative for agitation, behavioral problems, confusion, decreased concentration, dysphoric mood and sleep disturbance.        Objective:   Physical Exam Constitutional:      Appearance: He is well-developed.  HENT:     Head: Normocephalic and atraumatic.   Eyes:     Conjunctiva/sclera: Conjunctivae normal.    Cardiovascular:     Rate and Rhythm: Normal rate and regular rhythm.  Pulmonary:     Effort: Pulmonary effort is normal. No respiratory distress.     Breath sounds: No wheezing.  Abdominal:     General: There is no distension.     Palpations: Abdomen is soft.   Musculoskeletal:        General: No tenderness. Normal range of motion.     Cervical back: Normal range of motion and neck supple.   Skin:    General: Skin is warm and dry.     Coloration: Skin is not pale.     Findings: No erythema or rash.   Neurological:     General: No focal deficit present.     Mental Status: He is alert and oriented to person, place, and time.   Psychiatric:        Mood and Affect: Mood normal.        Behavior: Behavior normal.        Thought Content: Thought content normal.        Judgment: Judgment normal.           Assessment & Plan:   Assessment and Plan    ' Chronic urinary tract symptoms: Vast majority of these symptoms are NOT related to infection but rather other factors including his BPH, less frequently his DM --we did obtain a UA and culture today, SO THAT if somehow he DOES develop compelling symptoms in ensuing days we will have data to act upon if ua ahs pyuria and culture shows an organism   Bacteriuria:    does not equal infection though I  am sure he sometimes DOES have infections judicious use of antibiotics, RESTRAINT is critical     Benign prostatic hyperplasia Chronic urinary symptoms due to enlarged prostate. Prostate cancer ruled out. He was relieved to avoid surgery. -  Continue Gemtesa for urinary symptoms.   Type 2 diabetes mellitus A1c below 7 with significant weight loss. Managed with Mounjaro . Discussed potential medication reduction with further weight loss. - Continue Mounjaro . - Encourage continued weight loss and lifestyle modifications.  Chronic pain due to arthritis Chronic arthritis impacting mobility. Recent pain management injections. Discussed resistance training for metabolism and weight loss support. - Consider resistance training to increase metabolism and support weight loss.

## 2023-11-11 ENCOUNTER — Encounter: Payer: Self-pay | Admitting: Infectious Disease

## 2023-11-11 ENCOUNTER — Other Ambulatory Visit: Payer: Self-pay

## 2023-11-11 ENCOUNTER — Ambulatory Visit: Admitting: Infectious Disease

## 2023-11-11 VITALS — BP 138/89 | HR 66 | Ht 70.0 in | Wt 246.0 lb

## 2023-11-11 DIAGNOSIS — E119 Type 2 diabetes mellitus without complications: Secondary | ICD-10-CM

## 2023-11-11 DIAGNOSIS — R8271 Bacteriuria: Secondary | ICD-10-CM

## 2023-11-11 DIAGNOSIS — N401 Enlarged prostate with lower urinary tract symptoms: Secondary | ICD-10-CM

## 2023-11-11 DIAGNOSIS — N390 Urinary tract infection, site not specified: Secondary | ICD-10-CM | POA: Diagnosis not present

## 2023-11-11 DIAGNOSIS — Z794 Long term (current) use of insulin: Secondary | ICD-10-CM

## 2023-11-11 DIAGNOSIS — M174 Other bilateral secondary osteoarthritis of knee: Secondary | ICD-10-CM

## 2023-11-11 DIAGNOSIS — R3589 Other polyuria: Secondary | ICD-10-CM

## 2023-11-11 DIAGNOSIS — R300 Dysuria: Secondary | ICD-10-CM

## 2023-11-11 DIAGNOSIS — R350 Frequency of micturition: Secondary | ICD-10-CM

## 2023-11-12 LAB — URINALYSIS, ROUTINE W REFLEX MICROSCOPIC

## 2023-11-15 LAB — URINALYSIS, ROUTINE W REFLEX MICROSCOPIC
Bilirubin Urine: NEGATIVE
Glucose, UA: NEGATIVE
Hgb urine dipstick: NEGATIVE
Hyaline Cast: NONE SEEN /LPF
Nitrite: POSITIVE — AB
Protein, ur: NEGATIVE
RBC / HPF: NONE SEEN /HPF (ref 0–2)
Specific Gravity, Urine: 1.022 (ref 1.001–1.035)
pH: 5.5 (ref 5.0–8.0)

## 2023-11-15 LAB — URINE CULTURE
MICRO NUMBER:: 16599330
SPECIMEN QUALITY:: ADEQUATE

## 2023-11-15 LAB — MICROSCOPIC MESSAGE

## 2024-02-09 ENCOUNTER — Encounter: Payer: Self-pay | Admitting: Cardiology

## 2024-02-09 ENCOUNTER — Ambulatory Visit: Attending: Cardiology | Admitting: Cardiology

## 2024-02-09 VITALS — BP 110/76 | HR 65 | Ht 69.5 in | Wt 233.0 lb

## 2024-02-09 DIAGNOSIS — E785 Hyperlipidemia, unspecified: Secondary | ICD-10-CM | POA: Diagnosis not present

## 2024-02-09 DIAGNOSIS — I10 Essential (primary) hypertension: Secondary | ICD-10-CM | POA: Diagnosis not present

## 2024-02-09 DIAGNOSIS — Z136 Encounter for screening for cardiovascular disorders: Secondary | ICD-10-CM | POA: Diagnosis not present

## 2024-02-09 DIAGNOSIS — E1169 Type 2 diabetes mellitus with other specified complication: Secondary | ICD-10-CM | POA: Diagnosis not present

## 2024-02-09 NOTE — Patient Instructions (Signed)
 Medication Instructions:   No changes *If you need a refill on your cardiac medications before your next appointment, please call your pharmacy*   Lab Work: Not needed    Testing/Procedures:  Not needed  Follow-Up: At [facility], you and your health needs are our priority.  As part of our continuing mission to provide you with exceptional heart care, we have created designated Provider Care Teams.  These Care Teams include your primary Cardiologist (physician) and Advanced Practice Providers (APPs -  Physician Assistants and Nurse Practitioners) who all work together to provide you with the care you need, when you need it.     Your next appointment:   12 month(s)  The format for your next appointment:   In Person  Provider:   Josefa Beauvais, NP, Lum Louis, NP, Aline Door, PA-C, Damien Braver, NP, or Katlyn West, NP      Then, Alm Clay, MD will plan to see you again in 24 month(s).

## 2024-02-09 NOTE — Progress Notes (Signed)
 Cardiology Office Note:  .   Date:  02/17/2024  ID:  Carlos Kerns Sr., DOB 05-15-1966, MRN [id number] PCP: Carlos Pellet, MD  [facility] Providers Cardiologist:  Alm Clay, MD     Chief Complaint  Patient presents with   Follow-up    Postop follow-up-to discuss cardiovascular risks    Patient Profile: .     Carlos Patane Sr. is a obese 58 y.o. male non-smoker with a PMH notable for HTN, HLD and DM type II along with hypothyroidism (s/p thyroidectomy) who presents here for delayed 76-month follow-up.   He was originally seen for Preop Assessment for an upcoming appendectomy because of an abnormal EKG showing inverted T waves.  The original referral was at the request of Carlos Silversmith, MD.  By report, he has had 3 catheterizations in the past due to inverted T waves on EKG.  Was told he had no stenoses and has not had PCI.      [name] was seen for this preop evaluation on June 26, 2023.  He was somewhat incredulous as to why he was referred.  He had had a longstanding history of essentially LVH findings on EKG evaluated in the past by at least 3 catheterizations per his report.  He denied any cardiac symptoms besides some exertional dyspnea related to deconditioning.  In the absence of symptoms I thought it was not prudent to pursue ischemic evaluation..  We talked about him returning after his surgery to discuss cardiovascular risk factor modification.  He was already on essentially adequate control so we did not think that even the Coronary Calcium  Score would be necessary based on his existing medications.  Subjective  Discussed the use of AI scribe software for clinical note transcription with the patient, who gave verbal consent to proceed.  History of Present Illness Carlos Kersten Sr. is a 58 year old male who presents for follow-up after recent appendectomy and cardiac clearance.  He recently underwent an appendectomy, which was identified during  an MRI for a prostate biopsy. The surgery was successful with no complications, although he experienced expected postoperative pain.  Cardiac clearance was required prior to the surgery due to his history of abnormal EKGs, which have been evaluated multiple times with normal heart catheterizations. No current symptoms of chest pain, shortness of breath, heart racing, or swelling in the legs. He has not experienced any pass out spells.  He has a history of prostatitis and prostate issues, which led to the initial MRI that revealed the abnormal appendix. He was previously on a maintenance antibiotic for prostate issues, but this was discontinued by his urologist. He uses Gemtesa for urinary urgency related to prostate issues.  His hypertension is well-controlled with medications including amlodipine  5 mg and clonidine. His blood pressure has been stable.  Type 2 diabetes is managed with Mounjaro , and his last A1c in April was 6.4, indicating good control. He is also on atorvastatin  for cholesterol management, which is well-controlled.  He has a history of migraines, previously managed with Emgality, but he is now on Zonegran and baclofen as per his neurologist's recommendation.    Objective   Current Meds  Medication Sig   acetaminophen  (TYLENOL ) 325 MG tablet Take 650 mg by mouth every 6 (six) hours as needed for fever.   alfuzosin (UROXATRAL) 10 MG 24 hr tablet Take 10 mg by mouth daily with breakfast.   amLODipine  (NORVASC ) 5 MG tablet Take 2 tablets (10 mg total) by mouth daily.  Ascorbic Acid (VITAMIN C) 1000 MG tablet Take 1,000 mg by mouth daily.   aspirin  EC 81 MG tablet Take 81 mg by mouth in the morning.   atorvastatin  (LIPITOR) 20 MG tablet Take 20 mg by mouth at bedtime.   baclofen (LIORESAL) 10 MG tablet 1 TABLET TWICE A DAY AS NEEDED LIMIT 1-2 TREATMENTS PER WEEK   cloNIDine (CATAPRES) 0.2 MG tablet Take 0.2 mg by mouth 2 (two) times daily.   cyanocobalamin  (VITAMIN B12) 1000  MCG tablet Take 1,000 mcg by mouth daily.   levothyroxine (SYNTHROID) 137 MCG tablet Take 137 mcg by mouth daily.   MOUNJARO  12.5 MG/0.5ML Pen 12.5 mg Subcutaneous once a week   Polyethyl Glyc-Propyl Glyc PF (SYSTANE ULTRA PF) 0.4-0.3 % SOLN Place 1 drop into both eyes at bedtime as needed (Dry eye).   Polyethyl Glycol-Propyl Glycol (SYSTANE) 0.4-0.3 % GEL ophthalmic gel Place 1 Application into both eyes at bedtime as needed (Dry eyes).   potassium chloride  SA (KLOR-CON  M) 20 MEQ tablet Take 1 tablet (20 mEq total) by mouth 2 (two) times daily for 10 days. (Patient taking differently: Take 8 mEq by mouth 2 (two) times daily.)   Propylene Glycol, PF, (SYSTANE COMPLETE PF) 0.6 % SOLN Place 1 drop into both eyes daily as needed (dry eye).   traZODone (DESYREL) 50 MG tablet PLEASE SEE ATTACHED FOR DETAILED DIRECTIONS   Vibegron (GEMTESA) 75 MG TABS Take 75 mg by mouth daily at 12 noon.   zonisamide (ZONEGRAN) 50 MG capsule Take 150 mg by mouth daily.     Studies Reviewed: SABRA        No results found for: CHOL, HDL, LDLCALC, LDLDIRECT, TRIG, CHOLHDL Lab Results  Component Value Date   NA 142 09/01/2023   K 3.7 09/01/2023   CREATININE 1.09 09/01/2023   GFRNONAA >60 09/01/2023   GLUCOSE 120 (H) 09/01/2023   No results found for: HGBA1C  ECHO 01/2016: Normal LV size and function.  EF 55 to 60%.  Trace TR.  Otherwise normal He reports he has had 3 coronary catheterizations in his lifetime and told that he has no blockages around his heart. 1 of these was in [place] . He reports 2 others were performed at [facility].   Lab Results: 01/02/2023: TC 105, TG 71, HDL 34, LDL 56 (excellent); A1c 6.7; Hgb 13.1, CR 1.1, K+ 3.2; 09/21/2023: A1c 6.4,Cr 1.09, K+ 3.7  Risk Assessment/Calculations:          Physical Exam:   VS:  BP 110/76   Pulse 65   Ht 5' 9.5 (1.765 m)   Wt 233 lb (105.7 kg)   SpO2 96%   BMI 33.91 kg/m    Wt Readings from Last 3 Encounters:   02/09/24 233 lb (105.7 kg)  11/11/23 246 lb (111.6 kg)  09/10/23 243 lb (110.2 kg)    GEN: Well nourished, well groomed in no acute distress; moderately obese NECK: No JVD; No carotid bruits CARDIAC: Normal S1, S2; RRR, no murmurs, rubs, gallops RESPIRATORY:  Clear to auscultation without rales, wheezing or rhonchi ; nonlabored, good air movement. ABDOMEN: Soft, non-tender, non-distended EXTREMITIES:  No edema; No deformity      ASSESSMENT AND PLAN: .    Problem List Items Addressed This Visit       Cardiology Problems   Hyperlipidemia associated with type 2 diabetes mellitus (HCC) (Chronic)   Hyperlipidemia I do not see labs that were checked since last August, should be due to have some  checked soon.  But as of August 2024 his LDL was 56 on 20 mg atorvastatin . -Continue atorvastatin  40 mg daily.  Type 2 Diabetes Mellitus Well-controlled with A1c at 6.4. -Continue Mounjaro  at 12.5 mg weekly.      Relevant Medications   MOUNJARO  12.5 MG/0.5ML Pen   Primary hypertension - Primary (Chronic)   Normal blood pressure on unusual regimen, but doing well with that.  He is on amlodipine  10 mg along with Catapres 0.2 mg twice daily.  Previously was on HCTZ but discontinued due to hypokalemia.        Other   Screening for cardiovascular condition   We discussed his cardiovascular risk.  Basely he does have 3 risk factors of diabetes, hypertension and hyperlipidemia all of which are well-controlled.  He is also taking an 81 mg aspirin .  At this point with excellent respiratory control I do not think screening test such as coronary calcium  scores were a hsCRP would help at all.  He is not having any concerning symptoms therefore does not require ischemic evaluation. He has had heart catheterization in the past for his abnormal EKG and his echo did not show significant LVH.  Treatment for LVH if his hypertensive but seem to be treating blood pressures, but in the past his EKG was  evaluated and no concerns for HOCM were noted.       Assessment and Plan  Cardiovascular Risk Management EKG changes benign without symptoms. Effective risk reduction for coronary artery disease and stroke. - Continue clonidine, amlodipine , atorvastatin , aspirin . - Monitor for new symptoms: chest pain, dyspnea. - Follow-up with cardiology PA in one year, cardiologist in two years.        Follow-Up: Return in about 1 year (around 02/08/2025) for Follow-up with APP, Alternating annual follow-ups APP and MD.      Signed, Alm MICAEL Clay, MD, MS Alm Clay, M.D., M.S. Interventional Cardiologist  [facility] Pager # [phone]

## 2024-02-09 NOTE — Progress Notes (Deleted)
  Cardiology Office Note:  .   Date:  02/09/2024  ID:  Carlos Kerns Sr., DOB 08-Oct-1965, MRN [id number] PCP: [name], MD  [name] Providers Cardiologist:  Alm Clay, MD { Click to update primary MD,subspecialty MD or APP then REFRESH:1}    No chief complaint on file.   Patient Profile: .     Carlos Mccarver Sr. is a *** 58 y.o. male *** with a PMH notable for *** who presents here for *** at the request of [name], MD.  [employer] SR. is an obese 58 y.o. male non-smoker with a PMH notable for DM-2, hypothyroidism (s/p thyroidectomy) who presents here for Preop Assessment at the request of [name], MD. He is accompanied by his wife  By report, he has had 3 catheterizations in the past due to inverted T waves on EKG.  Was told he had no stenoses and has not had PCI.        Carlos Savas Sr. was last seen on ***  Subjective  Discussed the use of AI scribe software for clinical note transcription with the patient, who gave verbal consent to proceed.  History of Present Illness      Cardiovascular ROS: {roscv:310661}  ROS:  Review of Systems - {ros master:310782}    Objective    Studies Reviewed: .        Results  ECHO: *** CATH: *** MONITOR: *** CT: ***  Risk Assessment/Calculations:              Physical Exam:   VS:  BP 110/76   Pulse 65   Ht 5' 9.5 (1.765 m)   Wt 233 lb (105.7 kg)   SpO2 96%   BMI 33.91 kg/m    Wt Readings from Last 3 Encounters:  02/09/24 233 lb (105.7 kg)  11/11/23 246 lb (111.6 kg)  09/10/23 243 lb (110.2 kg)    Physical Exam    GEN: Well nourished, well developed in no acute distress; *** NECK: No JVD; No carotid bruits CARDIAC: Normal S1, S2; RRR, no murmurs, rubs, gallops RESPIRATORY:  Clear to auscultation without rales, wheezing or rhonchi ; nonlabored, good air movement. ABDOMEN: Soft, non-tender, non-distended EXTREMITIES:  No edema; No deformity      ASSESSMENT AND  PLAN: .    Problem List Items Addressed This Visit   None   Assessment and Plan Assessment & Plan        {Are you ordering a CV Procedure (e.g. stress test, cath, DCCV, TEE, etc)?   Press F2        :[id number]}   Follow-Up: No follow-ups on file.  I spent *** minutes in the care of [name] today including {CHL AMB CAR Time Based Billing Options STW (Optional):[phone]::documenting in the encounter.}      Signed, Alm MICAEL Clay, MD, MS Alm Clay, M.D., M.S. Interventional Cardiologist  [facility] Pager # [phone]

## 2024-02-17 ENCOUNTER — Encounter: Payer: Self-pay | Admitting: Cardiology

## 2024-02-17 NOTE — Assessment & Plan Note (Addendum)
 Hyperlipidemia I do not see labs that were checked since last August, should be due to have some checked soon.  But as of August 2024 his LDL was 56 on 20 mg atorvastatin . -Continue atorvastatin  40 mg daily.  Type 2 Diabetes Mellitus Well-controlled with A1c at 6.4. -Continue Mounjaro  at 12.5 mg weekly.

## 2024-02-17 NOTE — Assessment & Plan Note (Signed)
 We discussed his cardiovascular risk.  Basely he does have 3 risk factors of diabetes, hypertension and hyperlipidemia all of which are well-controlled.  He is also taking an 81 mg aspirin .  At this point with excellent respiratory control I do not think screening test such as coronary calcium  scores were a hsCRP would help at all.  He is not having any concerning symptoms therefore does not require ischemic evaluation. He has had heart catheterization in the past for his abnormal EKG and his echo did not show significant LVH.  Treatment for LVH if his hypertensive but seem to be treating blood pressures, but in the past his EKG was evaluated and no concerns for HOCM were noted.

## 2024-02-17 NOTE — Assessment & Plan Note (Signed)
 Normal blood pressure on unusual regimen, but doing well with that.  He is on amlodipine  10 mg along with Catapres 0.2 mg twice daily.  Previously was on HCTZ but discontinued due to hypokalemia.

## 2024-04-05 ENCOUNTER — Telehealth: Payer: Self-pay

## 2024-04-05 NOTE — Telephone Encounter (Signed)
 Patient called in requesting to get to get his second pair of Orthotics. He did confirm with his insurance on whether it is covered or not and his insurance confirmed that they will cover second pair. Patient would like a call back when order is placed for second pair.

## 2024-04-12 NOTE — Telephone Encounter (Signed)
 Order placed today Via Email to Cristie as last order was from January 2025 and is now a chief technology officer order  Lolita Schultze Cped

## 2024-04-26 ENCOUNTER — Telehealth: Payer: Self-pay

## 2024-04-26 NOTE — Telephone Encounter (Signed)
 Called patient back to inform him his orthotics have been shipped as of 04/19/2024.

## 2024-05-03 NOTE — Telephone Encounter (Signed)
 Called patient to let him know orthotics are in. Left a VM to have him call us  back to schedule the appointment PUO

## 2024-05-08 NOTE — Progress Notes (Unsigned)
 Subjective:  Chief Complainft ''    Patient ID: Carlos Kerns Sr., male    DOB: May 10, 1966, 58 y.o.   MRN: [id number]  HPI  Past Medical History:  Diagnosis Date   Anemia    juvenile hemoglobin (HbF) trace; A1c is not reliable per pt.   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 Cindy) no significant disease.  Similar to cardiac catheterization in the 1990s (1995)   Cardiac MRI  09/29/2006   [facility]: No evidence of HOCM   Carotid Dopplers  09/27/2006   1-39% stenosis of bilateral ICA.  Antegrade flow in bilateral vertebral arteries multiphasic subclavian artery flow and mild bilateral ECA stenosis.   EYE SURGERY Bilateral 2021   cataract removal   LAPAROSCOPIC APPENDECTOMY N/A 09/10/2023   Procedure: LAPAROSCOPIC APPENDECTOMY;  Surgeon: Ann Fine, MD;  Location: [facility] OR;  Service: General;  Laterality: N/A;   THYROIDECTOMY  2017   TRANSTHORACIC ECHOCARDIOGRAM  09/28/2006   [facility]: Mild to moderate LVH.  Hyperdynamic LV with EF 75 to 80%.  Normal aortic and mitral valves.  Right and left atria normal size.    Family History  Problem Relation Age of Onset   Brain cancer Mother    Diabetes Father    Stroke Father       Social History   Socioeconomic History   Marital status: Married    Spouse name: Not on file   Number of children: Not on file   Years of education: Not on file   Highest education level: Not on file  Occupational History   Not on file  Tobacco Use    Smoking status: Never   Smokeless tobacco: Never  Vaping Use   Vaping status: Never Used  Substance and Sexual Activity   Alcohol use: Not Currently   Drug use: Never   Sexual activity: Not on file  Other Topics Concern   Not on file  Social History Narrative         5/3-11/2006: Admitted to [facility] for sudden onset syncope   A) Heart Cath-angiographically normal coronary arteries.   B) Cardiac MRI Negative for HOCM   C) 72-Hour Holter Monitor Negative       Telemetry showed sinus rhythm with 1  AV block and QRS is within large voltage and dramatic ST depressions.   EKG 09/26/2006: Sinus bradycardia-58 bpm.  ST and T wave abnormality was noted with biphasic ST segments in V2 and V3 with T wave inversions in I, early 02, aVL, V3 through V6.  Consistent with LVH repolarization abnormality, versus ischemia.   Social Drivers of Health   Tobacco Use: Low Risk (02/17/2024)   Patient History    Smoking Tobacco Use: Never    Smokeless Tobacco Use: Never    Passive Exposure: Not on file  Financial Resource Strain: Not on file  Food Insecurity: Not on file  Transportation Needs: Not  on file  Physical Activity: Not on file  Stress: Not on file  Social Connections: Not on file  Depression (EYV7-0): Not on file  Alcohol Screen: Not on file  Housing: Unknown (06/16/2023)   Received from [facility]   Epic    Unable to Pay for Housing in the Last Year: Not on file    Number of Times Moved in the Last Year: Not on file    At any time in the past 12 months, were you homeless or living in a shelter (including now)?: No  Utilities: Not on file  Health Literacy: Not on file    Allergies[1]  Current Medications[2]   Review of Systems     Objective:   Physical Exam        Assessment & Plan:       [1]  Allergies Allergen Reactions   Sulfur Dioxide Hives   Amoxicillin     GI distress   Benicar  [Olmesartan ] Swelling     Significant angioedema with respiratory difficulty   Dapagliflozin Other (See Comments)    Farxiga/ Yeast infection   Metformin And Related Diarrhea   Sulfa Antibiotics     Hives   [2]  Current Outpatient Medications:    acetaminophen  (TYLENOL ) 325 MG tablet, Take 650 mg by mouth every 6 (six) hours as needed for fever., Disp: , Rfl:    alfuzosin (UROXATRAL) 10 MG 24 hr tablet, Take 10 mg by mouth daily with breakfast., Disp: , Rfl:    amLODipine  (NORVASC ) 5 MG tablet, Take 2 tablets (10 mg total) by mouth daily., Disp: 60 tablet, Rfl: 0   Ascorbic Acid (VITAMIN C) 1000 MG tablet, Take 1,000 mg by mouth daily., Disp: , Rfl:    aspirin  EC 81 MG tablet, Take 81 mg by mouth in the morning., Disp: , Rfl:    atorvastatin  (LIPITOR) 20 MG tablet, Take 20 mg by mouth at bedtime., Disp: , Rfl:    baclofen (LIORESAL) 10 MG tablet, 1 TABLET TWICE A DAY AS NEEDED LIMIT 1-2 TREATMENTS PER WEEK, Disp: , Rfl:    cloNIDine (CATAPRES) 0.2 MG tablet, Take 0.2 mg by mouth 2 (two) times daily., Disp: , Rfl:    cyanocobalamin  (VITAMIN B12) 1000 MCG tablet, Take 1,000 mcg by mouth daily., Disp: , Rfl:    EMGALITY 120 MG/ML SOAJ, Inject 120 mg into the skin every 30 (thirty) days. (Patient not taking: Reported on 02/09/2024), Disp: , Rfl:    levothyroxine (SYNTHROID) 137 MCG tablet, Take 137 mcg by mouth daily., Disp: , Rfl:    MOUNJARO  12.5 MG/0.5ML Pen, 12.5 mg Subcutaneous once a week, Disp: , Rfl:    Polyethyl Glyc-Propyl Glyc PF (SYSTANE ULTRA PF) 0.4-0.3 % SOLN, Place 1 drop into both eyes at bedtime as needed (Dry eye)., Disp: , Rfl:    Polyethyl Glycol-Propyl Glycol (SYSTANE) 0.4-0.3 % GEL ophthalmic gel, Place 1 Application into both eyes at bedtime as needed (Dry eyes)., Disp: , Rfl:    potassium chloride  SA (KLOR-CON  M) 20 MEQ tablet, Take 1 tablet (20 mEq total) by mouth 2 (two) times daily for 10 days. (Patient taking differently: Take 8 mEq by mouth 2 (two) times daily.), Disp: 20 tablet, Rfl: 0    Propylene Glycol, PF, (SYSTANE COMPLETE PF) 0.6 % SOLN, Place 1 drop into both eyes daily as needed (dry eye)., Disp: , Rfl:    traZODone (DESYREL) 50 MG tablet, PLEASE SEE ATTACHED FOR DETAILED DIRECTIONS, Disp: , Rfl:    Vibegron (GEMTESA)  75 MG TABS, Take 75 mg by mouth daily at 12 noon., Disp: , Rfl:    zonisamide (ZONEGRAN) 50 MG capsule, Take 150 mg by mouth daily., Disp: , Rfl:

## 2024-05-09 ENCOUNTER — Encounter: Payer: Self-pay | Admitting: Infectious Disease

## 2024-05-09 ENCOUNTER — Ambulatory Visit: Admitting: Infectious Disease

## 2024-05-09 ENCOUNTER — Other Ambulatory Visit: Payer: Self-pay

## 2024-05-09 VITALS — BP 127/91 | HR 68 | Ht 69.5 in | Wt 238.0 lb

## 2024-05-09 DIAGNOSIS — R8271 Bacteriuria: Secondary | ICD-10-CM | POA: Diagnosis not present

## 2024-05-09 DIAGNOSIS — R39198 Other difficulties with micturition: Secondary | ICD-10-CM | POA: Diagnosis not present

## 2024-05-09 DIAGNOSIS — R351 Nocturia: Secondary | ICD-10-CM | POA: Diagnosis not present

## 2024-05-09 DIAGNOSIS — R350 Frequency of micturition: Secondary | ICD-10-CM | POA: Diagnosis not present

## 2024-05-09 DIAGNOSIS — N401 Enlarged prostate with lower urinary tract symptoms: Secondary | ICD-10-CM

## 2024-05-09 DIAGNOSIS — R3589 Other polyuria: Secondary | ICD-10-CM

## 2024-05-09 DIAGNOSIS — R509 Fever, unspecified: Secondary | ICD-10-CM

## 2024-05-09 NOTE — Addendum Note (Signed)
 Addended by: FLEETA KATHIE JOMARIE LOISE on: 05/09/2024 04:51 PM   Modules accepted: Level of Service

## 2024-05-10 ENCOUNTER — Telehealth: Payer: Self-pay | Admitting: Podiatry

## 2024-05-10 NOTE — Telephone Encounter (Signed)
 Patient called in regards to getting a referral for diabetic shoes and whether he would need to see the provider first.

## 2024-05-12 NOTE — Telephone Encounter (Signed)
 Patient called and left message regarding order for diabetic shoes. I returned call to paient,but didn't receive an answer. I left message informing patient that the order would be at the front desk for patient to pick up.

## 2024-05-17 ENCOUNTER — Ambulatory Visit: Payer: Self-pay

## 2024-05-17 DIAGNOSIS — M2142 Flat foot [pes planus] (acquired), left foot: Secondary | ICD-10-CM

## 2024-05-17 DIAGNOSIS — M21611 Bunion of right foot: Secondary | ICD-10-CM

## 2024-05-17 DIAGNOSIS — M2011 Hallux valgus (acquired), right foot: Secondary | ICD-10-CM

## 2024-05-17 DIAGNOSIS — M19271 Secondary osteoarthritis, right ankle and foot: Secondary | ICD-10-CM

## 2024-05-17 DIAGNOSIS — M2141 Flat foot [pes planus] (acquired), right foot: Secondary | ICD-10-CM

## 2024-05-17 DIAGNOSIS — M7742 Metatarsalgia, left foot: Secondary | ICD-10-CM

## 2024-05-17 DIAGNOSIS — M7741 Metatarsalgia, right foot: Secondary | ICD-10-CM

## 2024-05-17 NOTE — Progress Notes (Signed)
 ORTHOTIC DISPENSING:   Reason for Visit:         Fitting and Delivery of Custom Fabricated Foot Orthoses Patient Report:            Patient reports comfort and is satisfied with device.   OBJECTIVE DATA: Patient History / Diagnosis:    No change in pathology Provided Device:                     Functional foot orthoses   GOAL OF ORTHOSIS - Improve gait - Decrease energy expenditure - Improve Balance - Provide Triplanar stability of foot complex - Facilitate motion   ACTIONS PERFORMED Patient was fit with custom foot orthoses   Patient was provided with verbal and written instruction and demonstration regarding wear, care, proper fit, function, and use of the orthosis.    Patient was also provided with verbal instruction regarding how to report any failures or malfunctions of the orthosis and necessary follow up care. Patient was also instructed to contact our office regarding any change in status that may affect the function of the orthosis.   Patient demonstrated understanding of all instructions.  - he stated he would like to get some diabetic shoes as his deductible has been met-  I gave him the name of the [facility] in [facility] as well as [facility] in [place] as he already has a Rx from Dr. Silva.  He said he tried [facility] and they were not helpful  Lamarr Salen, DPM

## 2025-05-08 ENCOUNTER — Ambulatory Visit: Payer: Self-pay | Admitting: Infectious Disease
